# Patient Record
Sex: Male | Born: 1980 | Race: Black or African American | Hispanic: No | Marital: Single
Health system: Midwestern US, Community
[De-identification: ages and names within clinical notes are randomized; demographics above are authoritative.]

## PROBLEM LIST (undated history)

## (undated) DIAGNOSIS — F1910 Other psychoactive substance abuse, uncomplicated: Secondary | ICD-10-CM

## (undated) HISTORY — PX: MANDIBLE FRACTURE SURGERY: SHX706

## (undated) HISTORY — DX: Other psychoactive substance abuse, uncomplicated: F19.10

---

## 2011-02-15 NOTE — ED Notes (Signed)
Pt states "has been having pain in both hands in the joints. The pain is worse when using them."

## 2011-02-15 NOTE — ED Notes (Signed)
Copy of discharge instructions given to patient. Denies any questions at this time.

## 2011-02-15 NOTE — ED Notes (Signed)
Practioner discussing test results with patient.

## 2011-02-15 NOTE — ED Provider Notes (Signed)
HPI Comments: Patient presents for evaluation of pain in the joints of his hands bilaterally for the last several months.  Pain is made worse when using them at work.  Patient has a job a the chicken farm. He denies erythema, edema, or stiffness.  No family history of RA or gout.    Patient is a 30 y.o. male presenting with hand pain. The history is provided by the patient.   Hand Pain   This is a chronic problem. The current episode started more than 1 week ago. The problem occurs daily. The problem has been gradually worsening. The pain is present in the left hand and right hand. The pain is at a severity of 8/10. Pertinent negatives include no numbness, no stiffness, no tingling, no itching, no back pain and no neck pain. The symptoms are aggravated by movement. He has tried nothing for the symptoms. There has been no history of extremity trauma.        No past medical history on file.     Past Surgical History   Procedure Date   ??? Hx heent      jaw         No family history on file.     History     Social History   ??? Marital Status: Single     Spouse Name: N/A     Number of Children: N/A   ??? Years of Education: N/A     Occupational History   ??? Not on file.     Social History Main Topics   ??? Smoking status: Current Everyday Smoker -- 0.2 packs/day   ??? Smokeless tobacco: Not on file   ??? Alcohol Use: Yes   ??? Drug Use: Yes     Special: Marijuana   ??? Sexually Active:      Other Topics Concern   ??? Not on file     Social History Narrative   ??? No narrative on file                  ALLERGIES: Review of patient's allergies indicates no known allergies.      Review of Systems   Constitutional: Negative for fever, activity change, fatigue and unexpected weight change.   HENT: Negative for neck pain and neck stiffness.    Musculoskeletal: Positive for arthralgias. Negative for myalgias, back pain, joint swelling, gait problem and stiffness.   Skin: Negative.  Negative for itching.    Neurological: Negative for tingling and numbness.       Filed Vitals:    02/15/11 1801   BP: 127/73   Pulse: 94   Temp: 98.4 ??F (36.9 ??C)   Resp: 16   Height: 5\' 9"  (1.753 m)   Weight: 63.504 kg (140 lb)   SpO2: 99%            Physical Exam   [nursing notereviewed.  Constitutional: He appears well-developed and well-nourished. No distress.   HENT:   Head: Normocephalic and atraumatic.   Eyes: Conjunctivae and EOM are normal. Right eye exhibits no discharge. Left eye exhibits no discharge. No scleral icterus.   Neck: Normal range of motion. Neck supple.   Cardiovascular: Normal rate.    Pulmonary/Chest: Effort normal. No respiratory distress.   Musculoskeletal: Normal range of motion. He exhibits no edema and no tenderness.        Right hand: He exhibits normal range of motion, no tenderness, no bony tenderness, normal capillary refill, no deformity, no laceration  and no swelling. normal sensation noted. Normal strength noted.        Left hand: He exhibits normal range of motion, no tenderness, no bony tenderness, normal capillary refill, no deformity, no laceration and no swelling. normal sensation noted. Normal strength noted.   Neurological: He is alert. He exhibits abnormal muscle tone.   Skin: Skin is warm and dry. No rash noted. He is not diaphoretic. No erythema. No pallor.   Psychiatric: He has a normal mood and affect.        MDM     Differential Diagnosis; Clinical Impression; Plan:     [X ray exam of both hands unremarkable.  Amount and/or Complexity of Data Reviewed:   Tests in the radiology section of CPT??:  [ordered and reviewed   Obtain history from someone other than the patient:  No   Review and summarize past medical records:  No   Discuss the patient with another provider:  No   Independant visualization of image, tracing, or specimen:  Yes  Risk of Significant Complications, Morbidity, and/or Mortality:   Presenting problems:  [moderate  Diagnostic procedures:  [moderate   Management options:  [low  Progress:   Patient progress:  [stable      Procedures

## 2011-02-16 MED ORDER — DICLOFENAC 75 MG TAB, DELAYED RELEASE
75 mg | ORAL_TABLET | Freq: Two times a day (BID) | ORAL | Status: AC
Start: 2011-02-16 — End: ?

## 2011-02-25 MED ORDER — HYDROCODONE-ACETAMINOPHEN 7.5 MG-500 MG TAB
ORAL_TABLET | ORAL | Status: AC | PRN
Start: 2011-02-25 — End: ?

## 2011-02-25 NOTE — ED Notes (Signed)
Consulted Dr. Manson Passey who was in agreement with assessment and plan to reduce angulation and apply ulnar gutter splint with followup with Dr. Manson Passey.

## 2011-02-25 NOTE — ED Provider Notes (Signed)
Patient is a 30 y.o. male presenting with hand swelling. The history is provided by the patient.   Hand Swelling   This is a new problem. The current episode started yesterday. The problem occurs constantly. The problem has not changed since onset.The pain is at a severity of 8/10. Associated symptoms include limited range of motion and stiffness. Pertinent negatives include no numbness.        No past medical history on file.     Past Surgical History   Procedure Date   ??? Hx heent      jaw   ??? Hx orthopaedic          No family history on file.     History     Social History   ??? Marital Status: Single     Spouse Name: N/A     Number of Children: N/A   ??? Years of Education: N/A     Occupational History   ??? Not on file.     Social History Main Topics   ??? Smoking status: Current Everyday Smoker -- 0.2 packs/day   ??? Smokeless tobacco: Never Used   ??? Alcohol Use: Yes   ??? Drug Use: Yes     Special: Marijuana   ??? Sexually Active:      Other Topics Concern   ??? Not on file     Social History Narrative   ??? No narrative on file                  ALLERGIES: Review of patient's allergies indicates no known allergies.      Review of Systems   Constitutional: Negative.  Negative for fever, diaphoresis and unexpected weight change.   Respiratory: Negative for cough and shortness of breath.    Cardiovascular: Negative for chest pain and palpitations.   Musculoskeletal: Positive for joint swelling, arthralgias and stiffness.   Skin: Negative for color change and pallor.   Neurological: Negative for numbness.   Psychiatric/Behavioral: Negative.  Negative for behavioral problems, confusion, self-injury and agitation.   [all other systems reviewed and are negative        Filed Vitals:    02/25/11 1125   BP: 123/67   Pulse: 97   Temp: 98.5 ??F (36.9 ??C)   Resp: 16   Height: 5\' 9"  (1.753 m)   Weight: 63.504 kg (140 lb)   SpO2: 99%            Physical Exam   [nursing notereviewed.   Constitutional: He is oriented to person, place, and time. He appears well-developed and well-nourished.  Non-toxic appearance. He does not have a sickly appearance. He does not appear ill. No distress.   HENT:   Head: Normocephalic and atraumatic.   Eyes: Conjunctivae and EOM are normal. Pupils are equal, round, and reactive to light.   Neck: Normal range of motion. Neck supple.   Cardiovascular: Normal rate, regular rhythm and normal heart sounds.  PMI is not displaced.  Exam reveals no gallop.    No murmur heard.  Pulmonary/Chest: Effort normal and breath sounds normal. No respiratory distress. He has no decreased breath sounds. He has no wheezes. He has no rhonchi. He has no rales.   Abdominal: Soft. There is no tenderness.   Musculoskeletal: He exhibits no edema and no tenderness.        Right hand: He exhibits decreased range of motion, tenderness, bony tenderness, deformity and swelling. He exhibits normal two-point discrimination, normal capillary refill and  no laceration. normal sensation noted. Normal strength noted.        Hands:  Neurological: He is alert and oriented to person, place, and time.   Skin: Skin is warm and dry. He is not diaphoretic.   Psychiatric: He has a normal mood and affect. His behavior is normal. Judgment and thought content normal.        MDM    Splint, Ulnar Gutter  Date/Time: 02/25/2011 1:29 PM  Performed by: PATime out: Immediately prior to the procedure a time out was called to verify the correct patient, procedure, equipment, staff and marking as appropriate..  Location details: right hand  Splint type: ulnar gutter  Approach: medial  Supplies used: Ortho-Glass  Post-procedure: The splinted body part was neurovascularly unchanged following the procedure.  Patient tolerance: Patient tolerated the procedure well with no immediate complications.  My total time at bedside, performing this procedure was 1-15 minutes.         I have discussed the results of labs, procedures, radiographs, treatments as well as any previous results found within the [facility] [employer] with the patient and available family.?? A treatment plan was developed in conjunction with the patient and was agreed upon. The patient is ready for discharge at this time.?? All voiced understanding of the discharge plan and medication instructions or changes as appropriate.?? Questions about treatment in the ED were answered.?? The patient was encouraged to return should symptoms worsen or new problems develop. A follow up physician was provided to the patient on the discharge papers.

## 2011-02-25 NOTE — ED Notes (Signed)
Pt hit cabinet has swelling to right hand.

## 2011-02-25 NOTE — ED Notes (Signed)
Copy of discharge instructions given to patient. Denies any questions at this time.

## 2018-08-21 ENCOUNTER — Encounter (HOSPITAL_COMMUNITY): Payer: Self-pay | Admitting: Emergency Medicine

## 2018-08-21 ENCOUNTER — Emergency Department (HOSPITAL_COMMUNITY)
Admission: EM | Admit: 2018-08-21 | Discharge: 2018-08-21 | Disposition: A | Discharge status: Home / Self Care | Payer: Self-pay | Attending: Emergency Medicine | Admitting: Emergency Medicine

## 2018-08-21 ENCOUNTER — Emergency Department (HOSPITAL_COMMUNITY): Payer: Self-pay

## 2018-08-21 DIAGNOSIS — R6884 Jaw pain: Secondary | ICD-10-CM | POA: Insufficient documentation

## 2018-08-21 DIAGNOSIS — F17210 Nicotine dependence, cigarettes, uncomplicated: Secondary | ICD-10-CM | POA: Insufficient documentation

## 2018-08-21 NOTE — ED Triage Notes (Signed)
BIB EMS from home reporting jaw pain. Pt states he broke his jaw a few weeks ago and had surgery to repair. Pt states the surgery was "botched" and he wants to sue the doctor because "something is poking out" in his mouth. Pt has taken nothing for pain.

## 2018-08-21 NOTE — ED Notes (Signed)
Pt not able to report a change or new injury only stating his jaw repair is still "irritating" and he thought he would come get it checked.,

## 2018-08-21 NOTE — ED Notes (Signed)
Patient verbalizes understanding of discharge instructions. Opportunity for questioning and answers were provided. Armband removed by staff, pt discharged from ED home via POV.  

## 2018-08-21 NOTE — Discharge Instructions (Addendum)
You have been seen today for jaw pain. Please read and follow all provided instructions.   1. Medications: usual home medications 2. Treatment: rest, drink plenty of fluids 3. Follow Up: Please call and schedule an appointment in 2-3 days with the numbers provided for further assessment of your jaw pain. Please follow up with your primary doctor in 7 days for discussion of your diagnoses and further evaluation after today's visit; if you do not have a primary care doctor use the resource guide provided to find one; Please return to the ER for any new or worsening symptoms. Please obtain all of your results from medical records or have your doctors office obtain the results - share them with your doctor - you should be seen at your doctors office. Call today to arrange your follow up.   Take medications as prescribed. Please review all of the medicines and only take them if you do not have an allergy to them. Return to the emergency room for worsening condition or new concerning symptoms. Follow up with your regular doctor. If you don't have a regular doctor use one of the numbers below to establish a primary care doctor.  Please be aware that if you are taking birth control pills, taking other prescriptions, ESPECIALLY ANTIBIOTICS may make the birth control ineffective - if this is the case, either do not engage in sexual activity or use alternative methods of birth control such as condoms until you have finished the medicine and your family doctor says it is OK to restart them. If you are on a blood thinner such as COUMADIN, be aware that any other medicine that you take may cause the coumadin to either work too much, or not enough - you should have your coumadin level rechecked in next 7 days if this is the case.  ?  It is also a possibility that you have an allergic reaction to any of the medicines that you have been prescribed - Everybody reacts differently to medications and while MOST people have no  trouble with most medicines, you may have a reaction such as nausea, vomiting, rash, swelling, shortness of breath. If this is the case, please stop taking the medicine immediately and contact your physician.  ?  You should return to the ER if you develop severe or worsening symptoms.   Emergency Department Resource Guide 1) Find a Doctor and Pay Out of Pocket Although you won't have to find out who is covered by your insurance plan, it is a good idea to ask around and get recommendations. You will then need to call the office and see if the doctor you have chosen will accept you as a new patient and what types of options they offer for patients who are self-pay. Some doctors offer discounts or will set up payment plans for their patients who do not have insurance, but you will need to ask so you aren't surprised when you get to your appointment.  2) Contact Your Local Health Department Not all health departments have doctors that can see patients for sick visits, but many do, so it is worth a call to see if yours does. If you don't know where your local health department is, you can check in your phone book. The CDC also has a tool to help you locate your state's health department, and many state websites also have listings of all of their local health departments.  3) Find a Walk-in Clinic If your illness is not likely to  be very severe or complicated, you may want to try a walk in clinic. These are popping up all over the country in pharmacies, drugstores, and shopping centers. They're usually staffed by nurse practitioners or physician assistants that have been trained to treat common illnesses and complaints. They're usually fairly quick and inexpensive. However, if you have serious medical issues or chronic medical problems, these are probably not your best option.  No Primary Care Doctor: Call Health Connect at  [phone] - they can help you locate a primary care doctor that  accepts your  insurance, provides certain services, etc. Physician Referral [phone]  Emergency Department Resource Guide 1) Find a Doctor and Pay Out of Pocket Although you won't have to find out who is covered by your insurance plan, it is a good idea to ask around and get recommendations. You will then need to call the office and see if the doctor you have chosen will accept you as a new patient and what types of options they offer for patients who are self-pay. Some doctors offer discounts or will set up payment plans for their patients who do not have insurance, but you will need to ask so you aren't surprised when you get to your appointment.  2) Contact Your Local Health Department Not all health departments have doctors that can see patients for sick visits, but many do, so it is worth a call to see if yours does. If you don't know where your local health department is, you can check in your phone book. The CDC also has a tool to help you locate your state's health department, and many state websites also have listings of all of their local health departments.  3) Find a Walk-in Clinic If your illness is not likely to be very severe or complicated, you may want to try a walk in clinic. These are popping up all over the country in pharmacies, drugstores, and shopping centers. They're usually staffed by nurse practitioners or physician assistants that have been trained to treat common illnesses and complaints. They're usually fairly quick and inexpensive. However, if you have serious medical issues or chronic medical problems, these are probably not your best option.  No Primary Care Doctor: Call Health Connect at  [phone] - they can help you locate a primary care doctor that  accepts your insurance, provides certain services, etc. Physician Referral Service- [phone]  Chronic Pain Problems: Organization         Address  Phone   Notes  [facility]  [phone]  Patients need to be referred by their primary care doctor.   Medication Assistance: Organization         Address  Phone   Notes  [facility] Medication [facility] [address] [place] [place], [place] [address] [phone] --Must be a resident of [facility] -- Must have NO insurance coverage whatsoever (no Medicaid/ Medicare, etc.) -- The pt. MUST have a primary care doctor that directs their care regularly and follows them in the community   [facility]  [phone]   [employer]  [phone]    Agencies that provide inexpensive medical care: Organization         Address  Phone   Notes  [facility]  [phone]   [facility]    [phone]   [facility] [address] [place], [place] [address] [phone]   [facility] [address] [place] [phone]   [facility]anned Parenthood    [phone]   [facility]    [phone]   Community [facility] and [facility]  [address], [place] Phone:  [phone], Fax:  [phone] Hours of Operation:  9 am - 6 pm, M-F.  Also accepts Medicaid/Medicare and self-pay.  [facility]  [address], [place] Phone: [phone], Fax: [phone]. Hours of Operation:  8:30 am - 5:30 pm, M-F.  Also accepts Medicaid and self-pay.  [facility] [place] [address] [place] Phone: [phone]   [facility] [address] Natasha Bence [place], [place] [phone], Ext. [phone] Mondays & Thursdays: 7-9 AM.  First 15 patients are seen on a first come, first serve basis.    Medicaid-accepting [facility] Providers:  Organization         Address  Phone   Notes  [facility] [address], [place] [phone] Also accepts self-pay patients.  [facility] [address] Laurell Josephs [place], [place]  [phone]   [facility] [address], [place] [phone]   [facility] [address], [place] [phone]   Renaye Rakers [address], [place]   [phone] Only accepts [place] patients after they have their name applied to their card.   Self-Pay (no insurance) in [facility]:  Organization         Address  Phone   Notes  Sickle Cell Patients, [facility] [address] [place], [place] [phone]   [facility] [address] [place], [place] [phone]   [facility] [place] [facility]  [address], [place] [phone]   [facility]/Dr. Osei-Bonsu  [address] [place] or [address], [place] [phone] Phone number for both [place] and [place] locations is the same.  [facility]gent [facility] and [facility] [address] [place] [phone]   [facility] [address], [place] or [address] [phone]   [facility] [address], [place] [phone], phone; [phone], fax Sees patients 1st and 3rd Saturday of every month.  Must not qualify for public or private insurance (i.e. Medicaid, Medicare, [place] Health Choice, Veterans' Benefits)  Household income should be no more than 200% of the poverty level The clinic cannot treat you if you are pregnant or think you are pregnant  Sexually transmitted diseases are not treated at the clinic.
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
## 2018-08-27 ENCOUNTER — Other Ambulatory Visit: Payer: Self-pay

## 2018-08-27 ENCOUNTER — Encounter (HOSPITAL_COMMUNITY): Payer: Self-pay | Admitting: Emergency Medicine

## 2018-08-27 ENCOUNTER — Emergency Department (HOSPITAL_COMMUNITY)
Admission: EM | Admit: 2018-08-27 | Discharge: 2018-08-27 | Disposition: A | Discharge status: Home / Self Care | Payer: Self-pay | Attending: Emergency Medicine | Admitting: Emergency Medicine

## 2018-08-27 DIAGNOSIS — R45851 Suicidal ideations: Secondary | ICD-10-CM

## 2018-08-27 DIAGNOSIS — F1014 Alcohol abuse with alcohol-induced mood disorder: Secondary | ICD-10-CM | POA: Diagnosis present

## 2018-08-27 DIAGNOSIS — F17210 Nicotine dependence, cigarettes, uncomplicated: Secondary | ICD-10-CM | POA: Insufficient documentation

## 2018-08-27 LAB — CBC
HCT: 39.1 % (ref 39.0–52.0)
Hemoglobin: 12.7 g/dL — ABNORMAL LOW (ref 13.0–17.0)
MCH: 31.2 pg (ref 26.0–34.0)
MCHC: 32.5 g/dL (ref 30.0–36.0)
MCV: 96.1 fL (ref 80.0–100.0)
Platelets: 298 10*3/uL (ref 150–400)
RBC: 4.07 MIL/uL — ABNORMAL LOW (ref 4.22–5.81)
RDW: 14.1 % (ref 11.5–15.5)
WBC: 5.3 10*3/uL (ref 4.0–10.5)
nRBC: 0 % (ref 0.0–0.2)

## 2018-08-27 LAB — COMPREHENSIVE METABOLIC PANEL
ALK PHOS: 46 U/L (ref 38–126)
ALT: 19 U/L (ref 0–44)
AST: 32 U/L (ref 15–41)
Albumin: 4.5 g/dL (ref 3.5–5.0)
Anion gap: 12 (ref 5–15)
BUN: 14 mg/dL (ref 6–20)
CO2: 26 mmol/L (ref 22–32)
CREATININE: 1.32 mg/dL — AB (ref 0.61–1.24)
Calcium: 9 mg/dL (ref 8.9–10.3)
Chloride: 102 mmol/L (ref 98–111)
GFR calc Af Amer: >60 mL/min (ref >60)
GFR calc non Af Amer: >60 mL/min (ref >60)
Glucose, Bld: 87 mg/dL (ref 70–99)
Potassium: 3.5 mmol/L (ref 3.5–5.1)
Sodium: 140 mmol/L (ref 135–145)
Total Bilirubin: 0.5 mg/dL (ref 0.3–1.2)
Total Protein: 7.2 g/dL (ref 6.5–8.1)

## 2018-08-27 LAB — ETHANOL: Alcohol, Ethyl (B): 35 mg/dL — ABNORMAL HIGH (ref <10)

## 2018-08-27 LAB — ACETAMINOPHEN LEVEL: Acetaminophen (Tylenol), Serum: <10 ug/mL — ABNORMAL LOW (ref 10–30)

## 2018-08-27 LAB — SALICYLATE LEVEL: Salicylate Lvl: <7 mg/dL (ref 2.8–30.0)

## 2018-08-27 NOTE — [facility] Suicide Risk Assessment (Signed)
Suicide Risk Assessment  Discharge Assessment   [facility]   Principal Problem: Alcohol abuse with alcohol-induced mood disorder [facility](HCC) Discharge Diagnoses: Principal Problem:   Alcohol abuse with alcohol-induced mood disorder (HCC)   Total Time spent with patient: 45 minutes  Musculoskeletal: Strength & Muscle Tone: within normal limits Gait & Station: normal Patient leans: N/A  Psychiatric Specialty Exam:   Blood pressure 109/73, pulse 96, temperature 98.3 F (36.8 C), temperature source Oral, resp. rate 18, height 5\' 9"  (1.753 m), weight 63.5 kg, SpO2 96 %.Body mass index is 20.67 kg/m.  General Appearance: Casual  Eye Contact::  Good  Speech:  Normal Rate409  Volume:  Decreased  Mood:  Irritable  Affect:  Congruent  Thought Process:  Coherent and Descriptions of Associations: Intact  Orientation:  Full (Time, Place, and Person)  Thought Content:  WDL and Logical  Suicidal Thoughts:  No  Homicidal Thoughts:  No  Memory:  Immediate;   Good Recent;   Good Remote;   Good  Judgement:  Good  Insight:  Fair  Psychomotor Activity:  Normal  Concentration:  Good  Recall:  Good  Fund of Knowledge:Good  Language: Good  Akathisia:  No  Handed:  Right  AIMS (if indicated):     Assets:  Leisure Time Physical Health Resilience  Sleep:     Cognition: WNL  ADL's:  Intact   Mental Status Per Nursing Assessment::   On Admission:   37 yo male who presented to the ED with jaw pain, substance abuse, and passive suicidal ideations.  He was upset they locked up his book with his belongings and refused to talk to TTS.  On assessment by the providers, he denies suicidal/homicidal ideations, hallucinations, or withdrawal symptoms.  Irritable mood with not wanting to talk but minimally.  Stable for discharge.  Demographic Factors:  Male  Loss Factors: NA  Historical Factors: NA  Risk Reduction Factors:   Sense of responsibility to family and Positive  social support  Continued Clinical Symptoms:  Irritability  Cognitive Features That Contribute To Risk:  None    Suicide Risk:  Minimal: No identifiable suicidal ideation.  Patients presenting with no risk factors but with morbid ruminations; may be classified as minimal risk based on the severity of the depressive symptoms    Plan Of Care/Follow-up recommendations:  Activity:  as tolerated Diet:  heart healthy diet  Alexi Geibel, NP 08/27/2018, 11:08 AM

## 2018-08-27 NOTE — ED Notes (Signed)
Patient refusing to get out of bed, blanket over head, will not make eye contact. Earlier refused to to talk with assessment team and psychiatric team. Unsure of what patient is needed. Belongings gathered and will have security escort patient out with paperwork and bus pass.

## 2018-08-27 NOTE — ED Triage Notes (Signed)
[facility] and [facility]. Pt denies any plan at this time.

## 2018-08-27 NOTE — Progress Notes (Signed)
TTS went to assess pt. Pt refused to speak with this Clinical research associatewriter. Pt states he was told he would be allowed to keep his book. Pt states he is not going to speak to anyone until his book is returned to him. Per nursing staff book is hardback and not allowed on unit. Pt refuses to answer questions or speak to this writer for the assessment. Pt to be assessed when he is willing to participate.   Christian Williams, MSW, LCSW Therapeutic Triage Specialist  [phone]

## 2018-08-27 NOTE — ED Notes (Signed)
Bed: WLPT4 Expected date:  Expected time:  Means of arrival:  Comments: EMS SI/depression

## 2018-08-27 NOTE — ED Provider Notes (Signed)
[facility]-EMERGENCY DEPT Provider Note   CSN: [id number] Arrival date & time: 08/27/18  0348     History   Chief Complaint Chief Complaint  Patient presents with  . Suicidal    HPI Christian Williams is a 37 y.o. male.  The history is provided by the patient and medical records.     37 year old male with no significant past medical history presenting to the ED for suicidal ideation.  Initially he refused to answer my questions or tell me what brought him into the ED today, he stated "I don't want to talk about".  He did eventually tell me "i've been going through some stuff".  He denies any specific plan of suicide.  He does report feeling homicidal-- reports "towards everyone".  He denies any auditory or visual hallucinations.    History reviewed. No pertinent past medical history.  There are no active problems to display for this patient.   Past Surgical History:  Procedure Laterality Date  . MANDIBLE FRACTURE SURGERY          Home Medications    Prior to Admission medications   Not on File    Family History History reviewed. No pertinent family history.  Social History Social History   Tobacco Use  . Smoking status: Current Some Day Smoker    Packs/day: 0.25    Types: Cigarettes  . Smokeless tobacco: Never Used  Substance Use Topics  . Alcohol use: Not Currently  . Drug use: Yes    Types: Cocaine, Marijuana     Allergies   Patient has no known allergies.   Review of Systems Review of Systems  Psychiatric/Behavioral: Positive for suicidal ideas.  All other systems reviewed and are negative.    Physical Exam Updated Vital Signs BP 109/73 (BP Location: Left Arm)   Pulse 96   Temp 98.3 F (36.8 C) (Oral)   Resp 18   Ht 5\' 9"  (1.753 m)   Wt 63.5 kg   SpO2 96%   BMI 20.67 kg/m   Physical Exam Vitals signs and nursing note reviewed.  Constitutional:      Appearance: He is well-developed.     Comments: Sleeping with  blankets over head, awoken for exam but will not remove blankets from head  HENT:     Head: Normocephalic and atraumatic.  Eyes:     Conjunctiva/sclera: Conjunctivae normal.     Pupils: Pupils are equal, round, and reactive to light.  Neck:     Musculoskeletal: Normal range of motion.  Cardiovascular:     Rate and Rhythm: Normal rate and regular rhythm.     Heart sounds: Normal heart sounds.  Pulmonary:     Effort: Pulmonary effort is normal.     Breath sounds: Normal breath sounds.  Abdominal:     General: Bowel sounds are normal.     Palpations: Abdomen is soft.  Musculoskeletal: Normal range of motion.  Skin:    General: Skin is warm and dry.  Neurological:     Mental Status: He is alert and oriented to person, place, and time.  Psychiatric:        Attention and Perception: He does not perceive auditory hallucinations.        Thought Content: Thought content includes suicidal ideation. Thought content does not include homicidal ideation. Thought content does not include homicidal or suicidal plan.      ED Treatments / Results  Labs (all labs ordered are listed, but only abnormal results are displayed)  Labs Reviewed  COMPREHENSIVE METABOLIC PANEL - Abnormal; Notable for the following components:      Result Value   Creatinine, Ser 1.32 (*)    All other components within normal limits  ETHANOL - Abnormal; Notable for the following components:   Alcohol, Ethyl (B) 35 (*)    All other components within normal limits  ACETAMINOPHEN LEVEL - Abnormal; Notable for the following components:   Acetaminophen (Tylenol), Serum <10 (*)    All other components within normal limits  CBC - Abnormal; Notable for the following components:   RBC 4.07 (*)    Hemoglobin 12.7 (*)    All other components within normal limits  SALICYLATE LEVEL  RAPID URINE DRUG SCREEN, HOSP PERFORMED    EKG None  Radiology No results found.  Procedures Procedures (including critical care  time)  Medications Ordered in ED Medications - No data to display   Initial Impression / Assessment and Plan / ED Course  I have reviewed the triage vital signs and the nursing notes.  Pertinent labs & imaging results that were available during my care of the patient were reviewed by me and considered in my medical decision making (see chart for details).  37 year old male here with suicidal ideation.  He refuses to answer most of my questions.  He does eventually tell me he has been "going through some stuff" but will not elaborate further.  No plan of suicide.  Reports HI against "everyone".  No AVH.  Screening labs here are overall reassuring, ethanol 35.  Patient medically cleared.  TTS evaluation pending.  TTS attempted to speak with patient-- he was unwilling to speak with them due to his book being taken away. This is hardback and not allowed in unit.  They will try to assess again later this morning.  Final Clinical Impressions(s) / ED Diagnoses   Final diagnoses:  Suicidal ideation    ED Discharge Orders    None       Garlon Hatchet, PA-C 08/27/18 1610    Devoria Albe, MD 08/27/18 [phone]

## 2018-08-27 NOTE — [facility] Suicide Risk Assessment (Signed)
37 yo male who presented to the ED with jaw pain, substance abuse, and passive suicidal ideations.  He was upset they locked up his book with his belongings and refused to talk to TTS.  On assessment by the providers, he denies suicidal/homicidal ideations, hallucinations, or withdrawal symptoms.  Irritable mood with not wanting to talk but minimally.  Stable for discharge.  Nanine MeansJamison Jaylynne Birkhead, PMHN

## 2018-08-27 NOTE — ED Notes (Addendum)
Escorted out without security

## 2020-07-06 ENCOUNTER — Encounter (HOSPITAL_COMMUNITY): Payer: Self-pay | Admitting: Emergency Medicine

## 2020-07-06 ENCOUNTER — Other Ambulatory Visit: Payer: Self-pay

## 2020-07-06 ENCOUNTER — Emergency Department (HOSPITAL_COMMUNITY)
Admission: EM | Admit: 2020-07-06 | Discharge: 2020-07-06 | Disposition: A | Discharge status: Home / Self Care | Payer: Self-pay | Attending: Emergency Medicine | Admitting: Emergency Medicine

## 2020-07-06 DIAGNOSIS — K921 Melena: Secondary | ICD-10-CM

## 2020-07-06 DIAGNOSIS — F17210 Nicotine dependence, cigarettes, uncomplicated: Secondary | ICD-10-CM | POA: Insufficient documentation

## 2020-07-06 DIAGNOSIS — N342 Other urethritis: Secondary | ICD-10-CM

## 2020-07-06 DIAGNOSIS — N341 Nonspecific urethritis: Secondary | ICD-10-CM | POA: Insufficient documentation

## 2020-07-06 LAB — CBC WITH DIFFERENTIAL/PLATELET
Abs Immature Granulocytes: 0.01 10*3/uL (ref 0.00–0.07)
Basophils Absolute: 0 10*3/uL (ref 0.0–0.1)
Basophils Relative: 1 %
Eosinophils Absolute: 0.1 10*3/uL (ref 0.0–0.5)
Eosinophils Relative: 1 %
HCT: 38.5 % — ABNORMAL LOW (ref 39.0–52.0)
Hemoglobin: 12.9 g/dL — ABNORMAL LOW (ref 13.0–17.0)
Immature Granulocytes: 0 %
Lymphocytes Relative: 29 %
Lymphs Abs: 1.7 10*3/uL (ref 0.7–4.0)
MCH: 33.2 pg (ref 26.0–34.0)
MCHC: 33.5 g/dL (ref 30.0–36.0)
MCV: 99.2 fL (ref 80.0–100.0)
Monocytes Absolute: 0.5 10*3/uL (ref 0.1–1.0)
Monocytes Relative: 9 %
Neutro Abs: 3.4 10*3/uL (ref 1.7–7.7)
Neutrophils Relative %: 60 %
Platelets: 269 10*3/uL (ref 150–400)
RBC: 3.88 MIL/uL — ABNORMAL LOW (ref 4.22–5.81)
RDW: 14.1 % (ref 11.5–15.5)
WBC: 5.6 10*3/uL (ref 4.0–10.5)
nRBC: 0 % (ref 0.0–0.2)

## 2020-07-06 LAB — URINALYSIS, ROUTINE W REFLEX MICROSCOPIC
Bilirubin Urine: NEGATIVE
Glucose, UA: NEGATIVE mg/dL
Ketones, ur: NEGATIVE mg/dL
Nitrite: NEGATIVE
Protein, ur: NEGATIVE mg/dL
Specific Gravity, Urine: 1.016 (ref 1.005–1.030)
WBC, UA: >50 WBC/hpf — ABNORMAL HIGH (ref 0–5)
pH: 5 (ref 5.0–8.0)

## 2020-07-06 LAB — HIV ANTIBODY (ROUTINE TESTING W REFLEX): HIV Screen 4th Generation wRfx: NONREACTIVE

## 2020-07-06 MED ORDER — LIDOCAINE HCL (PF) 1 % IJ SOLN
INTRAMUSCULAR | Status: AC
  Filled 2020-07-06: qty 5

## 2020-07-06 MED ORDER — AZITHROMYCIN 1 G PO PACK
1.0000 g | PACK | Freq: Once | ORAL | Status: AC
  Administered 2020-07-06: 1 g via ORAL
  Filled 2020-07-06: qty 1

## 2020-07-06 MED ORDER — CEFTRIAXONE SODIUM 500 MG IJ SOLR
500.0000 mg | Freq: Once | INTRAMUSCULAR | Status: AC
  Administered 2020-07-06: 500 mg via INTRAMUSCULAR
  Filled 2020-07-06: qty 500

## 2020-07-06 MED ORDER — CEPHALEXIN 500 MG PO CAPS: 500.0000 mg | ORAL_CAPSULE | Freq: Three times a day (TID) | ORAL | 0 refills | Status: DC

## 2020-07-06 NOTE — ED Notes (Signed)
Pt verbalized understanding of discharge paperwork, prescriptions and follow-up care 

## 2020-07-06 NOTE — ED Triage Notes (Signed)
Pt reports blood on toilet paper when he wipes intermittently x 1 year.  Also reports penile discharge and groin itching since last night.  Requesting STD testing.

## 2020-07-06 NOTE — ED Provider Notes (Signed)
[facility] EMERGENCY DEPARTMENT Provider Note   CSN: [id number] Arrival date & time: 07/06/20  1110     History Chief Complaint  Patient presents with  . Penile Discharge  . Blood In Stools    Lazaro Isenhower is a 39 y.o. male.  The history is provided by the patient and medical records.  Penile Discharge   Zahki Hoogendoorn is a 39 y.o. male who presents to the Emergency Department complaining of penile discharge and blood he stools. He presents the emergency department with two complaints. First complaint is tan colored penile discharge that started last night. He reports that he has itching when he urinates. He feels like it smells like yeast. He is sexually active with women, does not consistently use protection. He does have a former history of gonorrhea and chlamydia, status post treatment. He states that this feels different from prior episodes. He also complains of greater than one year of occasional blood when he wipes after having a bowel movement. He states that his stools are very inconsistency. He is not having any blood he stools. He states when he wipes there is sometimes blood on the tissue. He has no pain with bowel movements. He denies any shortness of breath, lightheadedness, nausea, vomiting. He does have occasional abdominal cramping since last night.    History reviewed. No pertinent past medical history.  Patient Active Problem List   Diagnosis Date Noted  . Alcohol abuse with alcohol-induced mood disorder (HCC) 08/27/2018    Past Surgical History:  Procedure Laterality Date  . MANDIBLE FRACTURE SURGERY         No family history on file.  Social History   Tobacco Use  . Smoking status: Current Some Day Smoker    Packs/day: 0.25    Types: Cigarettes  . Smokeless tobacco: Never Used  Substance Use Topics  . Alcohol use: Not Currently  . Drug use: Yes    Types: Cocaine, Marijuana    Home Medications Prior to Admission medications    Medication Sig Start Date End Date Taking? Authorizing Provider  cephALEXin (KEFLEX) 500 MG capsule Take 1 capsule (500 mg total) by mouth 3 (three) times daily. 07/06/20   Tilden Fossa, MD    Allergies    Patient has no known allergies.  Review of Systems   Review of Systems  Genitourinary: Positive for discharge.  All other systems reviewed and are negative.   Physical Exam Updated Vital Signs BP 121/73 (BP Location: Right Arm)   Pulse 71   Temp 98.3 F (36.8 C) (Oral)   Resp 16   SpO2 100%   Physical Exam Vitals and nursing note reviewed.  Constitutional:      Appearance: He is well-developed.  HENT:     Head: Normocephalic and atraumatic.  Cardiovascular:     Rate and Rhythm: Normal rate and regular rhythm.  Pulmonary:     Effort: Pulmonary effort is normal. No respiratory distress.  Abdominal:     Palpations: Abdomen is soft.     Tenderness: There is no abdominal tenderness. There is no guarding or rebound.  Genitourinary:    Penis: Normal.      Comments: No external or palpable internal hemorrhoids on DRE.  No gross blood on rectal exam Musculoskeletal:        General: No tenderness.  Skin:    General: Skin is warm and dry.  Neurological:     Mental Status: He is alert and oriented to person, place, and time.  Psychiatric:        Behavior: Behavior normal.     ED Results / Procedures / Treatments   Labs (all labs ordered are listed, but only abnormal results are displayed) Labs Reviewed  URINALYSIS, ROUTINE W REFLEX MICROSCOPIC - Abnormal; Notable for the following components:      Result Value   APPearance HAZY (*)    Hgb urine dipstick SMALL (*)    Leukocytes,Ua LARGE (*)    WBC, UA >50 (*)    Bacteria, UA RARE (*)    All other components within normal limits  CBC WITH DIFFERENTIAL/PLATELET - Abnormal; Notable for the following components:   RBC 3.88 (*)    Hemoglobin 12.9 (*)    HCT 38.5 (*)    All other components within normal limits    URINE CULTURE  HIV ANTIBODY (ROUTINE TESTING W REFLEX)  RPR  GC/CHLAMYDIA PROBE AMP ([place]) NOT AT [facility]    EKG None  Radiology No results found.  Procedures Procedures (including critical care time)  Medications Ordered in ED Medications  cefTRIAXone (ROCEPHIN) injection 500 mg (has no administration in time range)  azithromycin (ZITHROMAX) powder 1 g (has no administration in time range)    ED Course  I have reviewed the triage vital signs and the nursing notes.  Pertinent labs & imaging results that were available during my care of the patient were reviewed by me and considered in my medical decision making (see chart for details).    MDM Rules/Calculators/A&P                         patient here for evaluation of penile discharge and dysuria. He is non-toxic appearing on evaluation. No discharge seen on evaluation. UA with numerous WBCs present. Will treat for urethritis and possible UTI and send cultures.  Patient also with complaints of rectal bleeding for one year. He has no active bleeding on examination. Hemoglobin similar when compared to priors. Discussed with patient PCP follow-up as well as return precautions. Final Clinical Impression(s) / ED Diagnoses Final diagnoses:  Urethritis  Infective urethritis    Rx / DC Orders ED Discharge Orders         Ordered    cephALEXin (KEFLEX) 500 MG capsule  3 times daily        07/06/20 1437           Tilden Fossa, MD 07/06/20 1439

## 2020-07-07 LAB — GC/CHLAMYDIA PROBE AMP (~~LOC~~) NOT AT ARMC
Chlamydia: NEGATIVE
Comment: NEGATIVE
Comment: NORMAL
Neisseria Gonorrhea: POSITIVE — AB

## 2020-07-07 LAB — URINE CULTURE: Culture: NO GROWTH

## 2020-07-07 LAB — RPR: RPR Ser Ql: NONREACTIVE

## 2020-07-13 ENCOUNTER — Ambulatory Visit (HOSPITAL_COMMUNITY)
Admission: EM | Admit: 2020-07-13 | Discharge: 2020-07-13 | Disposition: A | Discharge status: Home / Self Care | Payer: Self-pay

## 2020-07-13 ENCOUNTER — Ambulatory Visit (HOSPITAL_COMMUNITY)
Admission: EM | Admit: 2020-07-13 | Discharge: 2020-07-14 | Disposition: A | Discharge status: Home / Self Care | Payer: No Payment, Other | Attending: Psychiatry | Admitting: Psychiatry

## 2020-07-13 ENCOUNTER — Other Ambulatory Visit: Payer: Self-pay

## 2020-07-13 ENCOUNTER — Encounter (HOSPITAL_COMMUNITY): Payer: Self-pay

## 2020-07-13 DIAGNOSIS — Y900 Blood alcohol level of less than 20 mg/100 ml: Secondary | ICD-10-CM | POA: Insufficient documentation

## 2020-07-13 DIAGNOSIS — F1994 Other psychoactive substance use, unspecified with psychoactive substance-induced mood disorder: Secondary | ICD-10-CM | POA: Insufficient documentation

## 2020-07-13 DIAGNOSIS — F17210 Nicotine dependence, cigarettes, uncomplicated: Secondary | ICD-10-CM | POA: Insufficient documentation

## 2020-07-13 DIAGNOSIS — Z20822 Contact with and (suspected) exposure to covid-19: Secondary | ICD-10-CM | POA: Insufficient documentation

## 2020-07-13 DIAGNOSIS — R454 Irritability and anger: Secondary | ICD-10-CM | POA: Insufficient documentation

## 2020-07-13 DIAGNOSIS — F1094 Alcohol use, unspecified with alcohol-induced mood disorder: Secondary | ICD-10-CM | POA: Insufficient documentation

## 2020-07-13 DIAGNOSIS — F32A Depression, unspecified: Secondary | ICD-10-CM | POA: Insufficient documentation

## 2020-07-13 LAB — POCT URINE DRUG SCREEN - MANUAL ENTRY (I-SCREEN)
POC Amphetamine UR: NOT DETECTED
POC Buprenorphine (BUP): NOT DETECTED
POC Cocaine UR: POSITIVE — AB
POC Marijuana UR: POSITIVE — AB
POC Methadone UR: NOT DETECTED
POC Methamphetamine UR: NOT DETECTED
POC Morphine: NOT DETECTED
POC Oxazepam (BZO): NOT DETECTED
POC Oxycodone UR: NOT DETECTED
POC Secobarbital (BAR): NOT DETECTED

## 2020-07-13 MED ORDER — MAGNESIUM HYDROXIDE 400 MG/5ML PO SUSP: 30.0000 mL | Freq: Every day | ORAL | Status: DC | PRN

## 2020-07-13 MED ORDER — ALUM & MAG HYDROXIDE-SIMETH 200-200-20 MG/5ML PO SUSP: 30.0000 mL | ORAL | Status: DC | PRN

## 2020-07-13 MED ORDER — TRAZODONE HCL 50 MG PO TABS: 50.0000 mg | ORAL_TABLET | Freq: Every evening | ORAL | Status: DC | PRN

## 2020-07-13 MED ORDER — ACETAMINOPHEN 325 MG PO TABS: 650.0000 mg | ORAL_TABLET | Freq: Four times a day (QID) | ORAL | Status: DC | PRN

## 2020-07-13 NOTE — ED Triage Notes (Signed)
Pt arrives as walk in. Reports worsening depression but that he has hid it for his whole life. Reports that he can no longer hold it in. Pt reports intermittent thoughts of not wanting to be here but denies active SI. Pt reports inability to function or focus. Pt calm & cooperative, A&O x4. In no acute distress at this time.

## 2020-07-13 NOTE — ED Provider Notes (Signed)
Behavioral Health Admission [facility])  Date: 07/13/20 Patient Name: Christian Williams MRN: [id number] Chief Complaint:  Chief Complaint  Patient presents with  . Depression      Diagnoses:  Final diagnoses:  None    HPI: Christian Williams is a 39 year old patient who voluntarily came to Behavioral Health Urgent Care [facility]). The patient stated, "can't you see I need help." The patient was irritable when he was awakened. The patient had been sleeping once he sat on the sofa. He was quickly arousable but irritable and, at a time, using expletive words when this writer attempts to assess him. The patient is under the influence which he did admit to using THC and alcohol. UDS shows him being positive cocaine and THC.  The patient expressed he believed he needed both therapy and medication management and stated that he used 2 grams of marijuana and ingested two 16-ounce beers before coming in for assistance. He shared he had been experiencing thoughts of hurting himself (though denied plans to kill himself) and thoughts of harming someone else three months ago (though denied intends to harm someone at this time). He shares he does not currently have a therapist or a psychiatrist at this time.  PHQ 2-9:     ED from 08/27/2018 in [facility]  C-SSRS RISK CATEGORY Low Risk       Total Time spent with patient: 20 minutes  Musculoskeletal  Strength & Muscle Tone: within normal limits Gait & Station: normal Patient leans: N/A  Psychiatric Specialty Exam  Presentation General Appearance: Disheveled;Bizarre  Eye Contact:Poor  Speech:Garbled;Slow;Slurred  Speech Volume:Increased  Handedness:Right   Mood and Affect  Mood:Angry;Anxious;Irritable  Affect:Blunt;Constricted;Inappropriate   Thought Process  Thought Processes:Disorganized  Descriptions of Associations:Loose  Orientation:Full (Time, Place and Person)  Thought  Content:Scattered;Illogical  Hallucinations:Hallucinations: None  Ideas of Reference:None  Suicidal Thoughts:Suicidal Thoughts: Yes, Passive SI Passive Intent and/or Plan: Without Intent;Without Plan  Homicidal Thoughts:Homicidal Thoughts: No   Sensorium  Memory:Immediate Fair;Recent Fair;Remote Fair  Judgment:Impaired  Insight:Lacking   Executive Functions  Concentration:Poor  Attention Span:Poor  Recall:Poor  Fund of Knowledge:Poor  Language:Poor   Psychomotor Activity  Psychomotor Activity:Psychomotor Activity: Normal   Assets  Assets:Communication Skills;Desire for Improvement;Resilience;Social Support   Sleep  Sleep:Sleep: Fair   Physical Exam ROS  Blood pressure 117/75, pulse 92, temperature 97.8 F (36.6 C), temperature source Temporal, resp. rate 17, height 5' 4.96" (1.65 m), weight 140 lb (63.5 kg), SpO2 98 %. Body mass index is 23.33 kg/m.  Past Psychiatric History:   Is the patient at risk to self? No  Has the patient been a risk to self in the past 6 months? Yes .    Has the patient been a risk to self within the distant past? Yes   Is the patient a risk to others? No   Has the patient been a risk to others in the past 6 months? No   Has the patient been a risk to others within the distant past? No   Past Medical History: History reviewed. No pertinent past medical history.  Past Surgical History:  Procedure Laterality Date  . MANDIBLE FRACTURE SURGERY      Family History: History reviewed. No pertinent family history.  Social History:  Social History   Socioeconomic History  . Marital status: Single    Spouse name: Not on file  . Number of children: Not on file  . Years of education: Not on file  . Highest education level:  Not on file  Occupational History  . Not on file  Tobacco Use  . Smoking status: Current Some Day Smoker    Packs/day: 0.25    Types: Cigarettes  . Smokeless tobacco: Never Used  Vaping Use  . Vaping  Use: Never used  Substance and Sexual Activity  . Alcohol use: Not Currently  . Drug use: Yes    Types: Marijuana  . Sexual activity: Not on file  Other Topics Concern  . Not on file  Social History Narrative  . Not on file   Social Determinants of Health   Financial Resource Strain:   . Difficulty of Paying Living Expenses: Not on file  Food Insecurity:   . Worried About Programme researcher, broadcasting/film/video in the Last Year: Not on file  . Ran Out of Food in the Last Year: Not on file  Transportation Needs:   . Lack of Transportation (Medical): Not on file  . Lack of Transportation (Non-Medical): Not on file  Physical Activity:   . Days of Exercise per Week: Not on file  . Minutes of Exercise per Session: Not on file  Stress:   . Feeling of Stress : Not on file  Social Connections:   . Frequency of Communication with Friends and Family: Not on file  . Frequency of Social Gatherings with Friends and Family: Not on file  . Attends Religious Services: Not on file  . Active Member of Clubs or Organizations: Not on file  . Attends Banker Meetings: Not on file  . Marital Status: Not on file  Intimate Partner Violence:   . Fear of Current or Ex-Partner: Not on file  . Emotionally Abused: Not on file  . Physically Abused: Not on file  . Sexually Abused: Not on file    SDOH:  SDOH Screenings   Alcohol Screen:   . Last Alcohol Screening Score (AUDIT): Not on file  Depression (PHQ2-9):   . PHQ-2 Score: Not on file  Financial Resource Strain:   . Difficulty of Paying Living Expenses: Not on file  Food Insecurity:   . Worried About Programme researcher, broadcasting/film/video in the Last Year: Not on file  . Ran Out of Food in the Last Year: Not on file  Housing:   . Last Housing Risk Score: Not on file  Physical Activity:   . Days of Exercise per Week: Not on file  . Minutes of Exercise per Session: Not on file  Social Connections:   . Frequency of Communication with Friends and Family: Not on  file  . Frequency of Social Gatherings with Friends and Family: Not on file  . Attends Religious Services: Not on file  . Active Member of Clubs or Organizations: Not on file  . Attends Banker Meetings: Not on file  . Marital Status: Not on file  Stress:   . Feeling of Stress : Not on file  Tobacco Use: High Risk  . Smoking Tobacco Use: Current Some Day Smoker  . Smokeless Tobacco Use: Never Used  Transportation Needs:   . Freight forwarder (Medical): Not on file  . Lack of Transportation (Non-Medical): Not on file    Last Labs:  Admission on 07/06/2020, Discharged on 07/06/2020  Component Date Value Ref Range Status  . Color, Urine 07/06/2020 YELLOW  YELLOW Final  . APPearance 07/06/2020 HAZY* CLEAR Final  . Specific Gravity, Urine 07/06/2020 1.016  1.005 - 1.030 Final  . pH 07/06/2020 5.0  5.0 - 8.0  Final  . Glucose, UA 07/06/2020 NEGATIVE  NEGATIVE mg/dL Final  . Hgb urine dipstick 07/06/2020 SMALL* NEGATIVE Final  . Bilirubin Urine 07/06/2020 NEGATIVE  NEGATIVE Final  . Ketones, ur 07/06/2020 NEGATIVE  NEGATIVE mg/dL Final  . Protein, ur 58/52/7782 NEGATIVE  NEGATIVE mg/dL Final  . Nitrite 42/35/3614 NEGATIVE  NEGATIVE Final  . Glori Luis 07/06/2020 LARGE* NEGATIVE Final  . RBC / HPF 07/06/2020 0-5  0 - 5 RBC/hpf Final  . WBC, UA 07/06/2020 >50* 0 - 5 WBC/hpf Final  . Bacteria, UA 07/06/2020 RARE* NONE SEEN Final  . Mucus 07/06/2020 PRESENT   Final   Performed at [facility], [address] [place], [place] [address]  . Specimen Description 07/06/2020 URINE, RANDOM   Final  . Special Requests 07/06/2020 NONE   Final  . Culture 07/06/2020    Final                   Value:NO GROWTH Performed at [facility], [address], [place], [place] [address]   . Report Status 07/06/2020 07/07/2020 FINAL   Final  . Neisseria Gonorrhea 07/06/2020 Positive*  Final  . Chlamydia 07/06/2020 Negative   Final  . Comment 07/06/2020 Normal Reference  [place]anger Chlamydia - Negative   Final  . Comment 07/06/2020 Normal Reference Range Neisseria Gonorrhea - Negative   Final  . RPR Ser Ql 07/06/2020 NON REACTIVE  NON REACTIVE Final   Performed at [facility], [address] [place], [place] [address]  . HIV Screen 4th Generation wRfx 07/06/2020 Non Reactive  Non Reactive Final   Performed at [facility], [address], [place], [place] [address]  . WBC 07/06/2020 5.6  4.0 - 10.5 K/uL Final  . RBC 07/06/2020 3.88* 4.22 - 5.81 MIL/uL Final  . Hemoglobin 07/06/2020 12.9* 13.0 - 17.0 g/dL Final  . HCT 12/45/8099 38.5* 39 - 52 % Final  . MCV 07/06/2020 99.2  80.0 - 100.0 fL Final  . MCH 07/06/2020 33.2  26.0 - 34.0 pg Final  . MCHC 07/06/2020 33.5  30.0 - 36.0 g/dL Final  . RDW 83/38/2505 14.1  11.5 - 15.5 % Final  . Platelets 07/06/2020 269  150 - 400 K/uL Final  . nRBC 07/06/2020 0.0  0.0 - 0.2 % Final  . Neutrophils Relative % 07/06/2020 60  % Final  . Neutro Abs 07/06/2020 3.4  1.7 - 7.7 K/uL Final  . Lymphocytes Relative 07/06/2020 29  % Final  . Lymphs Abs 07/06/2020 1.7  0.7 - 4.0 K/uL Final  . Monocytes Relative 07/06/2020 9  % Final  . Monocytes Absolute 07/06/2020 0.5  0.1 - 1.0 K/uL Final  . Eosinophils Relative 07/06/2020 1  % Final  . Eosinophils Absolute 07/06/2020 0.1  0.0 - 0.5 K/uL Final  . Basophils Relative 07/06/2020 1  % Final  . Basophils Absolute 07/06/2020 0.0  0.0 - 0.1 K/uL Final  . Immature Granulocytes 07/06/2020 0  % Final  . Abs Immature Granulocytes 07/06/2020 0.01  0.00 - 0.07 K/uL Final   Performed at [facility], [address], [place], [place] [address]    Allergies: Patient has no known allergies.  PTA Medications: (Not in a hospital admission)   Medical Decision Making    Recommendations  Based on my evaluation the patient does not appear to have an emergency medical condition.  Gillermo Murdoch, NP 07/13/20  11:33 PM

## 2020-07-14 DIAGNOSIS — F1994 Other psychoactive substance use, unspecified with psychoactive substance-induced mood disorder: Secondary | ICD-10-CM | POA: Diagnosis not present

## 2020-07-14 DIAGNOSIS — R454 Irritability and anger: Secondary | ICD-10-CM | POA: Diagnosis not present

## 2020-07-14 DIAGNOSIS — F1094 Alcohol use, unspecified with alcohol-induced mood disorder: Secondary | ICD-10-CM | POA: Diagnosis not present

## 2020-07-14 DIAGNOSIS — F32A Depression, unspecified: Secondary | ICD-10-CM | POA: Diagnosis not present

## 2020-07-14 LAB — COMPREHENSIVE METABOLIC PANEL
ALT: 22 U/L (ref 0–44)
AST: 33 U/L (ref 15–41)
Albumin: 4 g/dL (ref 3.5–5.0)
Alkaline Phosphatase: 72 U/L (ref 38–126)
Anion gap: 13 (ref 5–15)
BUN: 17 mg/dL (ref 6–20)
CO2: 25 mmol/L (ref 22–32)
Calcium: 9.3 mg/dL (ref 8.9–10.3)
Chloride: 99 mmol/L (ref 98–111)
Creatinine, Ser: 1.07 mg/dL (ref 0.61–1.24)
GFR, Estimated: >60 mL/min (ref >60)
Glucose, Bld: 80 mg/dL (ref 70–99)
Potassium: 3.7 mmol/L (ref 3.5–5.1)
Sodium: 137 mmol/L (ref 135–145)
Total Bilirubin: 0.6 mg/dL (ref 0.3–1.2)
Total Protein: 6.5 g/dL (ref 6.5–8.1)

## 2020-07-14 LAB — ETHANOL: Alcohol, Ethyl (B): <10 mg/dL (ref <10)

## 2020-07-14 LAB — CBC WITH DIFFERENTIAL/PLATELET
Abs Immature Granulocytes: 0 10*3/uL (ref 0.00–0.07)
Basophils Absolute: 0.1 10*3/uL (ref 0.0–0.1)
Basophils Relative: 1 %
Eosinophils Absolute: 0.3 10*3/uL (ref 0.0–0.5)
Eosinophils Relative: 4 %
HCT: 40 % (ref 39.0–52.0)
Hemoglobin: 13.3 g/dL (ref 13.0–17.0)
Immature Granulocytes: 0 %
Lymphocytes Relative: 44 %
Lymphs Abs: 2.5 10*3/uL (ref 0.7–4.0)
MCH: 32.4 pg (ref 26.0–34.0)
MCHC: 33.3 g/dL (ref 30.0–36.0)
MCV: 97.6 fL (ref 80.0–100.0)
Monocytes Absolute: 0.5 10*3/uL (ref 0.1–1.0)
Monocytes Relative: 9 %
Neutro Abs: 2.4 10*3/uL (ref 1.7–7.7)
Neutrophils Relative %: 42 %
Platelets: 288 10*3/uL (ref 150–400)
RBC: 4.1 MIL/uL — ABNORMAL LOW (ref 4.22–5.81)
RDW: 13.8 % (ref 11.5–15.5)
WBC: 5.8 10*3/uL (ref 4.0–10.5)
nRBC: 0 % (ref 0.0–0.2)

## 2020-07-14 LAB — CBG MONITORING, ED: Amended Report: 93

## 2020-07-14 LAB — LIPID PANEL
Cholesterol: 152 mg/dL (ref 0–200)
HDL: 83 mg/dL (ref >40)
LDL Cholesterol: 49 mg/dL (ref 0–99)
Total CHOL/HDL Ratio: 1.8 RATIO
Triglycerides: 101 mg/dL (ref <150)
VLDL: 20 mg/dL (ref 0–40)

## 2020-07-14 LAB — HEMOGLOBIN A1C
Hgb A1c MFr Bld: 5.7 % — ABNORMAL HIGH (ref 4.8–5.6)
Mean Plasma Glucose: 116.89 mg/dL

## 2020-07-14 LAB — MAGNESIUM: Magnesium: 2.3 mg/dL (ref 1.7–2.4)

## 2020-07-14 LAB — TSH: TSH: 1.761 u[IU]/mL (ref 0.350–4.500)

## 2020-07-14 LAB — POC SARS CORONAVIRUS 2 AG -  ED: SARS Coronavirus 2 Ag: NEGATIVE

## 2020-07-14 LAB — RESPIRATORY PANEL BY RT PCR (FLU A&B, COVID)
Influenza A by PCR: NEGATIVE
Influenza B by PCR: NEGATIVE
SARS Coronavirus 2 by RT PCR: NEGATIVE

## 2020-07-14 LAB — GLUCOSE, CAPILLARY: Glucose-Capillary: 93 mg/dL (ref 70–99)

## 2020-07-14 NOTE — ED Notes (Signed)
Pt sleeping@this time. Breathing even and unlabored will continue to monitor for safety 

## 2020-07-14 NOTE — ED Notes (Signed)
Pt is extremely agitated and wanting to leave.  He is cursing and stating that this was a waste of his time.  He is stating that he has to hurt himself or someone else just to get help.

## 2020-07-14 NOTE — Progress Notes (Signed)
Christian Williams received his AVS, questions answered and retrieved  his personal belongings. He was given lunch and escorted to the lobby where he made a phone call. He was given a bus pass.

## 2020-07-14 NOTE — ED Notes (Signed)
PATIENT BELONGINGS: 1 JEANS, A PAIR OF SANDALS, CUP, PLASTIC BAG, ANS PERSONAL SUPPLIES ARE STORED IN LOCKER#13

## 2020-07-14 NOTE — BH Assessment (Signed)
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 He shared he had been experiencing thoughts of hurting himself (though denied plans to kill himself) and thoughts of harming someone else 3 months ago (though denied plans to harm someone at this time). He shares he does not currently have a therapist or a psychiatrist at this time.  When clinician entered the room to meet with pt, pt had pushed two of the chairs in the room together and was lying across them asleep. Clinician attempted to wake pt for the assessment and pt was able to verify his name and his DOB. When clinician asked why pt decided to come in tonight, pt stated he has been experiencing "weird thoughts" but was unable to elaborate. Pt was drowsy throughout this conversation and was difficult to keep awake. Clinician offered to come back later when pt was more awake and pt agreed to this. However, when this was attempted by the NP at a later time, pt became irate and back-talked and swore at the NP, so it was determined that pt could stay overnight at the [facility], based on the answers he provided when he checked in, and that he could be re-assessed in the morning to determine what would best meet his needs at that time.  Pt's protective factors are UTA at this time.  Pt was unable to give verbal consent for clinician to contact friends/family members to gather collateral information.  Pt's orientation and memory is UTA. Pt was unable to participate in the assessment at this time. Pt's  insight, judgement, and impulse control is UTA at this time.   Recommendations for Services/Supports/Treatments: Elenore Paddy, NP, reviewed pt's chart and information and determined pt should be observed overnight for safety and stability and be re-assessed in the morning by psychiatry.   Patient Reported Information How did you hear about [place]? Self  Referral name: Self  Referral phone number: No data recorded  Whom do you see for routine medical problems? I don't have a doctor  Practice/Facility Name: No data recorded Practice/Facility Phone Number: No data recorded Name of Contact: No data recorded Contact Number: No data recorded Contact Fax Number: No data recorded Prescriber Name: No data recorded Prescriber Address (if known): No data recorded  What Is the Reason for Your Visit/Call Today? "My cry for help of self-comfort."  How Long Has This Been Causing You Problems? > than 6 months  What Do You Feel Would Help You the Most Today? Therapy;Medication   Have You Recently Been in Any Inpatient Treatment (Hospital/Detox/Crisis Center/28-Day Program)? No  Name/Location of Program/Hospital:No data recorded How Long Were You There? No data recorded When Were You Discharged? No data recorded  Have You Ever Received Services From [facility] Before? No  Who Do You See at [facility]? No data recorded  Have You Recently Had Any Thoughts About Hurting Yourself? Yes  Are You Planning to Commit Suicide/Harm Yourself At This time? No   Have you Recently Had Thoughts About Hurting Someone Karolee Ohs? Yes  Explanation: No data recorded  Have You Used Any Alcohol  or Drugs in the Past 24 Hours? Yes  How Long Ago Did You Use Drugs or Alcohol? No data recorded What Did You Use and How Much? Marijuana (2 grams) and beer (2 16 ounce beverages)   Do You Currently Have a Therapist/Psychiatrist? No  Name of Therapist/Psychiatrist: No data recorded  Have You Been Recently  Discharged From Any Office Practice or Programs? No  Explanation of Discharge From Practice/Program: No data recorded    CCA Screening Triage Referral Assessment Type of Contact: Face-to-Face  Is this Initial or Reassessment? No data recorded Date Telepsych consult ordered in [facility]:  No data recorded Time Telepsych consult ordered in [facility]:  No data recorded  Patient Reported Information Reviewed? Yes  Patient Left Without Being Seen? No data recorded Reason for Not Completing Assessment: No data recorded  Collateral Involvement: Pt declined to provide verbal consent for clinician to make contact with friends/family for collateral information.   Does Patient Have a Automotive engineer Guardian? No data recorded Name and Contact of Legal Guardian: No data recorded If Minor and Not Living with Parent(s), Who has Custody? N/A  Is CPS involved or ever been involved? No data recorded Is APS involved or ever been involved? No data recorded  Patient Determined To Be At Risk for Harm To Self or Others Based on Review of Patient Reported Information or Presenting Complaint? No  Method: No data recorded Availability of Means: No data recorded Intent: No data recorded Notification Required: No data recorded Additional Information for Danger to Others Potential: No data recorded Additional Comments for Danger to Others Potential: No data recorded Are There Guns or Other Weapons in Your Home? No data recorded Types of Guns/Weapons: No data recorded Are These Weapons Safely Secured?                            No data recorded Who Could Verify You Are Able To Have These Secured: No data recorded Do You Have any Outstanding Charges, Pending Court Dates, Parole/Probation? No data recorded Contacted To Inform of Risk of Harm To Self or Others: No data recorded  Location of Assessment: [facility]   Does Patient Present under Involuntary Commitment? No  IVC Papers Initial  File Date: No data recorded  [place] of Residence: [place]   Patient Currently Receiving the Following Services: Not Receiving Services   Determination of Need: Emergent (2 hours)   Options For Referral: [facility]     CCA Biopsychosocial  Intake/Chief Complaint:  CCA Intake With Chief Complaint Chief Complaint/Presenting Problem: "My cry for help of self-comfort." Patient's Currently Reported Symptoms/Problems: Pt states he's experiencing "weird thoughts." Individual's Strengths: UTA Individual's Preferences: Pt expressed a desire for therapy and medication management. Individual's Abilities: UTA Type of Services Patient Feels Are Needed: Pt would like to receive therapy and medication management services. Initial Clinical Notes/Concerns: N/A  Mental Health Symptoms Depression:  Depression:  (UTA)  Mania:  Mania:  (UTA)  Anxiety:   Anxiety:  (UTA)  Psychosis:  Psychosis:  (UTA)  Trauma:  Trauma:  (UTA)  Obsessions:  Obsessions:  (UTA)  Compulsions:  Compulsions:  (UTA)  Inattention:  Inattention:  (UTA)  Hyperactivity/Impulsivity:  Hyperactivity/Impulsivity:  (UTA)  Oppositional/Defiant Behaviors:  Oppositional/Defiant Behaviors:  (UTA)  Emotional Irregularity:  Emotional Irregularity:  (UTA)  Other Mood/Personality Symptoms:  Other Mood/Personality Symptoms: UTA   Mental Status Exam Appearance and self-care  Stature:  Stature: Average  Weight:  Weight: Average weight  Clothing:  Clothing: Disheveled  Grooming:  Grooming: Normal  Cosmetic use:  Cosmetic Use: None  Posture/gait:  Posture/Gait: Other (Comment) (Pt had put two chairs together and was lying across them.)  Motor activity:  Motor Activity: Not Remarkable  Sensorium  Attention:  Attention: Unaware  Concentration:  Concentration:  (Pt was not concentrating)  Orientation:  Orientation:  (UTA)  Recall/memory:  Recall/Memory:  (UTA)  Affect and Mood  Affect:  Affect:  (UTA)  Mood:  Mood:  (UTA)   Relating  Eye contact:  Eye Contact:  (UTA)  Facial expression:  Facial Expression:  (UTA)  Attitude toward examiner:  Attitude Toward Examiner: Uninterested (UTA - pt was sleepy and unable to actively participate)  Thought and Language  Speech flow: Speech Flow:  (UTA - pt did not actively participate in the assessment)  Thought content:  Thought Content:  (UTA)  Preoccupation:  Preoccupations:  (UTA)  Hallucinations:  Hallucinations:  (UTA)  Organization:     Company secretary of Knowledge:  Fund of Knowledge:  Industrial/product designer)  Intelligence:  Intelligence:  Industrial/product designer)  Abstraction:  Abstraction:  Industrial/product designer)  Judgement:  Judgement:  Industrial/product designer)  Reality Testing:  Reality Testing:  Industrial/product designer)  Insight:  Insight:  Industrial/product designer)  Decision Making:  Decision Making:  Industrial/product designer)  Social Functioning  Social Maturity:  Social Maturity:  Industrial/product designer)  Social Judgement:  Social Judgement:  (UTA)  Stress  Stressors:  Stressors:  (UTA)  Coping Ability:  Coping Ability:  Industrial/product designer)  Skill Deficits:  Skill Deficits:  Industrial/product designer)  Supports:  Supports:  Industrial/product designer)     Religion: Religion/Spirituality Are You A Religious Person?:  (UTA) How Might This Affect Treatment?: UTA  Leisure/Recreation: Leisure / Recreation Do You Have Hobbies?:  (UTA)  Exercise/Diet: Exercise/Diet Do You Exercise?:  (UTA) Have You Gained or Lost A Significant Amount of Weight in the Past Six Months?:  (UTA) Do You Follow a Special Diet?:  (UTA) Do You Have Any Trouble Sleeping?:  (UTA)   CCA Employment/Education  Employment/Work Situation: Employment / Work Situation Employment situation:  Industrial/product designer) Patient's job has been impacted by current illness:  (N/A) What is the longest time patient has a held a job?: N/A Where was the patient employed at that time?: N/A Has patient ever been in the [employer]?:  Industrial/product designer)  Education: Education Is Patient Currently Attending School?:  (UTA) Last Grade Completed:  (N/A) Name of High School: N/A Did Garment/textile technologist From [employer]?:  (N/A) Did You Attend College?:  (N/A) Did You Attend Graduate School?:  (N/A) Did You Have Any Special Interests In School?: N/A Did You Have An Individualized Education Program (IIEP):  (N/A) Did You Have Any Difficulty At School?:  (N/A) Patient's Education Has Been Impacted by Current Illness:  (N/A)   CCA Family/Childhood History  Family and Relationship History: Family history Marital status:  (N/A) Are you sexually active?:  (N/A) What is your sexual orientation?: N/A Has your sexual activity been affected by drugs, alcohol, medication, or emotional stress?: N/A Does patient have children?:  (N/A)  Childhood History:  Childhood History By whom was/is the patient raised?:  (N/A) Additional childhood history information: N/A Description of patient's relationship with caregiver when they were a child: N/A Patient's description of current relationship with people who raised him/her: N/A How were you disciplined when you got in trouble as a child/adolescent?: N/A Does patient have siblings?:  (N/A) Did patient suffer any verbal/emotional/physical/sexual abuse as a child?:  (UTA)  Did patient suffer from severe childhood neglect?:  (UTA) Has patient ever been sexually abused/assaulted/raped as an adolescent or adult?:  (UTA) Was the patient ever a victim of a crime or a disaster?:  (UTA) Witnessed domestic violence?:  (UTA) Has patient been affected by domestic violence as an adult?:  Industrial/product designer(UTA)  Child/Adolescent Assessment:     CCA Substance Use  Alcohol/Drug Use: Alcohol / Drug Use Pain Medications: Please see MAR Prescriptions: Please see MAR Over the Counter: Please see MAR History of alcohol / drug use?: Yes Longest period of sobriety (when/how long): Unknown Substance #1 Name of Substance 1: Marijuana 1 - Age of First Use: Unknown 1 - Amount (size/oz): Unknown 1 - Frequency: Unknown 1 - Duration: Unknown 1 - Last Use / Amount: Used 2 grams  tonight Substance #2 Name of Substance 2: EtOH 2 - Age of First Use: Unknown 2 - Amount (size/oz): Unknown 2 - Frequency: Unknown 2 - Duration: Unknown 2 - Last Use / Amount: Drank 2 16-ounce beers tonight                     ASAM's:  Six Dimensions of Multidimensional Assessment  Dimension 1:  Acute Intoxication and/or Withdrawal Potential:      Dimension 2:  Biomedical Conditions and Complications:      Dimension 3:  Emotional, Behavioral, or Cognitive Conditions and Complications:     Dimension 4:  Readiness to Change:     Dimension 5:  Relapse, Continued use, or Continued Problem Potential:     Dimension 6:  Recovery/Living Environment:     ASAM Severity Score:    ASAM Recommended Level of Treatment:     Substance use Disorder (SUD)    Recommendations for Services/Supports/Treatments: Elenore PaddyJackie Thompson, NP, reviewed pt's chart and information and determined pt should be observed overnight for safety and stability and be re-assessed in the morning by psychiatry.   DSM5 Diagnoses: Patient Active Problem List   Diagnosis Date Noted  . 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

## 2020-07-14 NOTE — Discharge Instructions (Addendum)
Please come [facility]this facility) during walk in hours for appointment with psychiatrist/therapist to establish care.  Walk in hours are 8-11 AM Monday through Thursday. There is often a wait, and it is best to arrive by 7:30 AM.   Address:  [address] in [place], [address] Ph: [phone]

## 2020-07-14 NOTE — ED Provider Notes (Signed)
[facility] ASAP Discharge Summary  Date and Time: 07/14/2020 12:22 PM  Name: Christian Williams  MRN:  [id number]   Discharge Diagnoses:  Final diagnoses:  Substance induced mood disorder (HCC)    Subjective: Chart reviewed. Patient interviewed bedside. He is irritable but cooperative. Pt TP is linear. He states that he came to get help with "my thoughts and my depression". He states that he has been on medication in the past but that he did not find it helpful and that it "made it worse". He reports previous medications as "vistaril, tramadol, benadryl". When asked to elaborate on his depression he states "I'm overloaded with stress". Patient needed prompting to provide any detail regarding his symptoms, when prompted further, he states "life and people". Pt denied active SI but admits to passive SI without intent or plan at times but volunteers he wouldn't harm himself. Denied HI.  Denied AVH currently but states that he may have been experiencing AH last night, but he is unsure of they were his own thoughts. Pt states that he is originally from [place] and [place] but intends to stay in [place]. He states that he has had trouble finding a job because he has been trying to contact the social security office about getting a replacement social security card but that this has been slow d/t covid but states that his brother is assisting him with this. Patient states he smokes marijuana daily and that it is the only thing that helps with sleep, reports intermittent cocaine use. Pt minimizes substance use and does not believe it is a problem. When discussing resources with patient, he declines substance use resources because he does not believe substance use is  a problem. He declined homeless resources and stated the does not like to stay at shelters and describes having poor frustration tolerance and "getting angry" easily at others at the shelter. When asked what he needs help with  he states he wants to get back on medication. While discussing walk in appointnments, patient states that he doesn't like talking to people and "won't walk in". Discussed with patient that he had expressed interest in medication and therapy which is best done for a walk in appointment. At this time, patient becomes agitated and called me a "bitch" and says that he shouldn't have come here since we are not offering him him help despite providing him with resources to obtain help. Informed patient of discharge and that walk in resources will be provided in his discharge paperwork.  Stay Summary: Patient presented as a walk in on 10/31, reporting worsening depression that he had hid his whole life. He reported intermittent SI. On initial assessment by NP Janee Mornhompson, patient was irritable and using expletive words at provider. Reported passive SI without plan or intent. UDS+cocaine and THC. This AM patient remained irritable. He had presented requesting help; per documentation was requesting medication management and therapy. Pt did not meet criteria for inpatient admission (see above; no imminent risk to self or others ) and when discussing resources available pt become angry and responded with foul language. He minimized substance use and declined help. He is homeless but declined homeless resources.   Pt has refused to participate fully in evaluations with both the overnight NP and myself. He gives vague answers about his  suicidality as well as his depression, and when pressed for specifics, he becomes irritable and defensive. Supportive psychotherapy and suicide risk reduction techniques were attempted, but the pt  refused to engage. This is not consistent with someone who presents to the ER hospital claiming to want help for SI. Patient stated that he was interested in therapy and medication management, but on my evaluation was he refuses to entertain any outpatient treatment options. Unclear if secondary gain is  a motive, as patient is currently homeless and/or if personality pathology is at play     Total Time spent with patient: 20 minutes  Past Psychiatric History: see H &P Past Medical History: History reviewed. No pertinent past medical history.  Past Surgical History:  Procedure Laterality Date  . MANDIBLE FRACTURE SURGERY     Family History: History reviewed. No pertinent family history. Family Psychiatric History: see H&P Social History:  Social History   Substance and Sexual Activity  Alcohol Use Not Currently     Social History   Substance and Sexual Activity  Drug Use Yes  . Types: Marijuana    Social History   Socioeconomic History  . Marital status: Single    Spouse name: Not on file  . Number of children: Not on file  . Years of education: Not on file  . Highest education level: Not on file  Occupational History  . Not on file  Tobacco Use  . Smoking status: Current Some Day Smoker    Packs/day: 0.25    Types: Cigarettes  . Smokeless tobacco: Never Used  Vaping Use  . Vaping Use: Never used  Substance and Sexual Activity  . Alcohol use: Not Currently  . Drug use: Yes    Types: Marijuana  . Sexual activity: Not on file  Other Topics Concern  . Not on file  Social History Narrative  . Not on file   Social Determinants of Health   Financial Resource Strain:   . Difficulty of Paying Living Expenses: Not on file  Food Insecurity:   . Worried About Programme researcher, broadcasting/film/video in the Last Year: Not on file  . Ran Out of Food in the Last Year: Not on file  Transportation Needs:   . Lack of Transportation (Medical): Not on file  . Lack of Transportation (Non-Medical): Not on file  Physical Activity:   . Days of Exercise per Week: Not on file  . Minutes of Exercise per Session: Not on file  Stress:   . Feeling of Stress : Not on file  Social Connections:   . Frequency of Communication with Friends and Family: Not on file  . Frequency of Social Gatherings with  Friends and Family: Not on file  . Attends Religious Services: Not on file  . Active Member of Clubs or Organizations: Not on file  . Attends Banker Meetings: Not on file  . Marital Status: Not on file   SDOH:  SDOH Screenings   Alcohol Screen:   . Last Alcohol Screening Score (AUDIT): Not on file  Depression (PHQ2-9):   . PHQ-2 Score: Not on file  Financial Resource Strain:   . Difficulty of Paying Living Expenses: Not on file  Food Insecurity:   . Worried About Programme researcher, broadcasting/film/video in the Last Year: Not on file  . Ran Out of Food in the Last Year: Not on file  Housing:   . Last Housing Risk Score: Not on file  Physical Activity:   . Days of Exercise per Week: Not on file  . Minutes of Exercise per Session: Not on file  Social Connections:   . Frequency of Communication with Friends and  Family: Not on file  . Frequency of Social Gatherings with Friends and Family: Not on file  . Attends Religious Services: Not on file  . Active Member of Clubs or Organizations: Not on file  . Attends Banker Meetings: Not on file  . Marital Status: Not on file  Stress:   . Feeling of Stress : Not on file  Tobacco Use: High Risk  . Smoking Tobacco Use: Current Some Day Smoker  . Smokeless Tobacco Use: Never Used  Transportation Needs:   . Freight forwarder (Medical): Not on file  . Lack of Transportation (Non-Medical): Not on file    Has this patient used any form of tobacco in the last 30 days? (Cigarettes, Smokeless Tobacco, Cigars, and/or Pipes) Prescription not provided because: n.a  Current Medications:  Current Facility-Administered Medications  Medication Dose Route Frequency Provider Last Rate Last Admin  . acetaminophen (TYLENOL) tablet 650 mg  650 mg Oral Q6H PRN Gillermo Murdoch, NP      . alum & mag hydroxide-simeth (MAALOX/MYLANTA) 200-200-20 MG/5ML suspension 30 mL  30 mL Oral Q4H PRN Gillermo Murdoch, NP      . magnesium hydroxide  (MILK OF MAGNESIA) suspension 30 mL  30 mL Oral Daily PRN Gillermo Murdoch, NP      . traZODone (DESYREL) tablet 50 mg  50 mg Oral QHS PRN Gillermo Murdoch, NP       No current outpatient medications on file.    PTA Medications: (Not in a hospital admission)   Musculoskeletal  Strength & Muscle Tone: within normal limits Gait & Station: normal Patient leans: N/A  Psychiatric Specialty Exam  Presentation  General Appearance: Appropriate for Environment;Disheveled  Eye Contact:Poor  Speech:Clear and Coherent;Normal Rate  Speech Volume:Normal  Handedness:Left   Mood and Affect  Mood:Dysphoric  Affect:Blunt;Other (comment) (irritable)   Thought Process  Thought Processes:Goal Directed;Linear  Descriptions of Associations:Intact  Orientation:Full (Time, Place and Person)  Thought Content:WDL  Hallucinations:Hallucinations: None  Ideas of Reference:None  Suicidal Thoughts:Suicidal Thoughts: No SI Passive Intent and/or Plan: Without Intent;Without Plan  Homicidal Thoughts:Homicidal Thoughts: No   Sensorium  Memory:Immediate Fair;Recent Fair  Judgment:Intact  Insight:Other (comment) (limited)   Executive Functions  Concentration:Fair  Attention Span:Fair  Recall:Fair  [employer] of Knowledge:Fair  Language:Good   Psychomotor Activity  Psychomotor Activity:Psychomotor Activity: Normal   Assets  Assets:Communication Skills;Desire for Improvement;Resilience   Sleep  Sleep:Sleep: Fair   Physical Exam  Physical Exam Constitutional:      Appearance: Normal appearance. He is normal weight.  HENT:     Head: Normocephalic and atraumatic.  Eyes:     Extraocular Movements: Extraocular movements intact.  Pulmonary:     Effort: Pulmonary effort is normal.  Neurological:     Mental Status: He is alert.    Review of Systems  Psychiatric/Behavioral: Positive for depression and substance abuse. Negative for hallucinations.   Blood pressure  117/75, pulse 92, temperature 97.8 F (36.6 C), temperature source Temporal, resp. rate 17, height 5' 4.96" (1.65 m), weight 63.5 kg, SpO2 98 %. Body mass index is 23.33 kg/m.  Demographic Factors:  Male, Low socioeconomic status and Unemployed  Loss Factors: NA  Historical Factors: Impulsivity  Risk Reduction Factors:   Positive social support  Continued Clinical Symptoms:  Alcohol/Substance Abuse/Dependencies Previous Psychiatric Diagnoses and Treatments  Cognitive Features That Contribute To Risk:  Closed-mindedness   , poor frustration tolerance   Suicide Risk:  Minimal: No identifiable suicidal ideation.  Patients presenting with no risk factors but  with morbid ruminations; may be classified as minimal risk based on the severity of the depressive symptoms  Plan Of Care/Follow-up recommendations:  Activity:  as tolerated Diet:  regular Other:      Patient has been instructed & cautioned: To not engage in alcohol and or illegal drug use while on prescription medicines. In the event of worsening symptoms, patient is instructed to call the crisis hotline, 911 and or go to the nearest ED for appropriate evaluation and treatment of symptoms. To follow-up with his/her primary care provider for your other medical issues, concerns and or health care needs. Provided with walk information at the  [facility] and encouraged to follow up      Disposition: self care  Estella Husk, MD 07/14/2020, 12:22 PM

## 2020-07-15 LAB — PROLACTIN: Prolactin: 9.8 ng/mL (ref 4.0–15.2)

## 2020-08-17 ENCOUNTER — Encounter (HOSPITAL_COMMUNITY): Payer: Self-pay | Admitting: Emergency Medicine

## 2020-08-17 ENCOUNTER — Other Ambulatory Visit: Payer: Self-pay

## 2020-08-17 ENCOUNTER — Ambulatory Visit (HOSPITAL_COMMUNITY)
Admission: EM | Admit: 2020-08-17 | Discharge: 2020-08-18 | Disposition: A | Discharge status: Home / Self Care | Payer: No Payment, Other | Attending: Family | Admitting: Family

## 2020-08-17 DIAGNOSIS — F1994 Other psychoactive substance use, unspecified with psychoactive substance-induced mood disorder: Secondary | ICD-10-CM

## 2020-08-17 DIAGNOSIS — F1410 Cocaine abuse, uncomplicated: Secondary | ICD-10-CM

## 2020-08-17 DIAGNOSIS — F1914 Other psychoactive substance abuse with psychoactive substance-induced mood disorder: Secondary | ICD-10-CM | POA: Insufficient documentation

## 2020-08-17 DIAGNOSIS — F17210 Nicotine dependence, cigarettes, uncomplicated: Secondary | ICD-10-CM | POA: Insufficient documentation

## 2020-08-17 DIAGNOSIS — F29 Unspecified psychosis not due to a substance or known physiological condition: Secondary | ICD-10-CM

## 2020-08-17 DIAGNOSIS — Z20822 Contact with and (suspected) exposure to covid-19: Secondary | ICD-10-CM | POA: Insufficient documentation

## 2020-08-17 DIAGNOSIS — F1910 Other psychoactive substance abuse, uncomplicated: Secondary | ICD-10-CM

## 2020-08-17 DIAGNOSIS — F1014 Alcohol abuse with alcohol-induced mood disorder: Secondary | ICD-10-CM | POA: Diagnosis not present

## 2020-08-17 DIAGNOSIS — F1290 Cannabis use, unspecified, uncomplicated: Secondary | ICD-10-CM

## 2020-08-17 DIAGNOSIS — F19950 Other psychoactive substance use, unspecified with psychoactive substance-induced psychotic disorder with delusions: Secondary | ICD-10-CM | POA: Insufficient documentation

## 2020-08-17 DIAGNOSIS — F14259 Cocaine dependence with cocaine-induced psychotic disorder, unspecified: Secondary | ICD-10-CM | POA: Insufficient documentation

## 2020-08-17 DIAGNOSIS — Z5900 Homelessness unspecified: Secondary | ICD-10-CM

## 2020-08-17 LAB — CBC WITH DIFFERENTIAL/PLATELET
Abs Immature Granulocytes: 0.01 10*3/uL (ref 0.00–0.07)
Basophils Absolute: 0.1 10*3/uL (ref 0.0–0.1)
Basophils Relative: 1 %
Eosinophils Absolute: 0.2 10*3/uL (ref 0.0–0.5)
Eosinophils Relative: 3 %
HCT: 40.4 % (ref 39.0–52.0)
Hemoglobin: 13.5 g/dL (ref 13.0–17.0)
Immature Granulocytes: 0 %
Lymphocytes Relative: 38 %
Lymphs Abs: 2.2 10*3/uL (ref 0.7–4.0)
MCH: 32.5 pg (ref 26.0–34.0)
MCHC: 33.4 g/dL (ref 30.0–36.0)
MCV: 97.1 fL (ref 80.0–100.0)
Monocytes Absolute: 0.5 10*3/uL (ref 0.1–1.0)
Monocytes Relative: 8 %
Neutro Abs: 2.8 10*3/uL (ref 1.7–7.7)
Neutrophils Relative %: 50 %
Platelets: 304 10*3/uL (ref 150–400)
RBC: 4.16 MIL/uL — ABNORMAL LOW (ref 4.22–5.81)
RDW: 13.2 % (ref 11.5–15.5)
WBC: 5.7 10*3/uL (ref 4.0–10.5)
nRBC: 0 % (ref 0.0–0.2)

## 2020-08-17 LAB — LIPID PANEL
Cholesterol: 168 mg/dL (ref 0–200)
HDL: 107 mg/dL (ref >40)
LDL Cholesterol: 45 mg/dL (ref 0–99)
Total CHOL/HDL Ratio: 1.6 RATIO
Triglycerides: 78 mg/dL (ref <150)
VLDL: 16 mg/dL (ref 0–40)

## 2020-08-17 LAB — COMPREHENSIVE METABOLIC PANEL
ALT: 27 U/L (ref 0–44)
AST: 39 U/L (ref 15–41)
Albumin: 4.4 g/dL (ref 3.5–5.0)
Alkaline Phosphatase: 49 U/L (ref 38–126)
Anion gap: 12 (ref 5–15)
BUN: 20 mg/dL (ref 6–20)
CO2: 27 mmol/L (ref 22–32)
Calcium: 9.6 mg/dL (ref 8.9–10.3)
Chloride: 102 mmol/L (ref 98–111)
Creatinine, Ser: 1.1 mg/dL (ref 0.61–1.24)
GFR, Estimated: >60 mL/min (ref >60)
Glucose, Bld: 100 mg/dL — ABNORMAL HIGH (ref 70–99)
Potassium: 3.4 mmol/L — ABNORMAL LOW (ref 3.5–5.1)
Sodium: 141 mmol/L (ref 135–145)
Total Bilirubin: 0.8 mg/dL (ref 0.3–1.2)
Total Protein: 6.9 g/dL (ref 6.5–8.1)

## 2020-08-17 LAB — HEMOGLOBIN A1C
Hgb A1c MFr Bld: 5.6 % (ref 4.8–5.6)
Mean Plasma Glucose: 114.02 mg/dL

## 2020-08-17 LAB — RESP PANEL BY RT-PCR (FLU A&B, COVID) ARPGX2
Influenza A by PCR: NEGATIVE
Influenza B by PCR: NEGATIVE
SARS Coronavirus 2 by RT PCR: NEGATIVE

## 2020-08-17 LAB — POC SARS CORONAVIRUS 2 AG: SARS Coronavirus 2 Ag: NEGATIVE

## 2020-08-17 LAB — POCT URINE DRUG SCREEN - MANUAL ENTRY (I-SCREEN)
POC Amphetamine UR: NOT DETECTED
POC Buprenorphine (BUP): NOT DETECTED
POC Cocaine UR: POSITIVE — AB
POC Marijuana UR: POSITIVE — AB
POC Methadone UR: NOT DETECTED
POC Methamphetamine UR: NOT DETECTED
POC Morphine: NOT DETECTED
POC Oxazepam (BZO): NOT DETECTED
POC Oxycodone UR: NOT DETECTED
POC Secobarbital (BAR): NOT DETECTED

## 2020-08-17 LAB — ETHANOL: Alcohol, Ethyl (B): <10 mg/dL (ref <10)

## 2020-08-17 LAB — TSH: TSH: 0.76 u[IU]/mL (ref 0.350–4.500)

## 2020-08-17 LAB — MAGNESIUM: Magnesium: 2.4 mg/dL (ref 1.7–2.4)

## 2020-08-17 LAB — POC SARS CORONAVIRUS 2 AG -  ED: SARS Coronavirus 2 Ag: NEGATIVE

## 2020-08-17 MED ORDER — MAGNESIUM HYDROXIDE 400 MG/5ML PO SUSP: 30.0000 mL | Freq: Every day | ORAL | Status: DC | PRN

## 2020-08-17 MED ORDER — ALUM & MAG HYDROXIDE-SIMETH 200-200-20 MG/5ML PO SUSP: 30.0000 mL | ORAL | Status: DC | PRN

## 2020-08-17 MED ORDER — OLANZAPINE 2.5 MG PO TABS
2.5000 mg | ORAL_TABLET | Freq: Every day | ORAL | Status: DC
  Administered 2020-08-17: 2.5 mg via ORAL
  Filled 2020-08-17: qty 1
  Filled 2020-08-17: qty 7

## 2020-08-17 MED ORDER — HYDROXYZINE HCL 25 MG PO TABS
25.0000 mg | ORAL_TABLET | Freq: Three times a day (TID) | ORAL | Status: DC | PRN
  Filled 2020-08-17: qty 10

## 2020-08-17 MED ORDER — ACETAMINOPHEN 325 MG PO TABS: 650.0000 mg | ORAL_TABLET | Freq: Four times a day (QID) | ORAL | Status: DC | PRN

## 2020-08-17 MED ORDER — TRAZODONE HCL 50 MG PO TABS
50.0000 mg | ORAL_TABLET | Freq: Every evening | ORAL | Status: DC | PRN
  Administered 2020-08-17: 50 mg via ORAL
  Filled 2020-08-17: qty 1
  Filled 2020-08-17: qty 7

## 2020-08-17 NOTE — ED Provider Notes (Signed)
Behavioral Health Admission H&P [facility] & OBS)  Date: 08/17/20 Patient Name: Christian Williams MRN: [id number] Chief Complaint:  Chief Complaint  Patient presents with  . Addiction Problem    with psychosis   Chief Complaint/Presenting Problem: ''I am in spirital warfare... I am fighting demons and fallen angels''  Diagnoses:  Final diagnoses:  Psychosis, unspecified psychosis type (HCC)  Cocaine use disorder (HCC)  Marijuana use    HPI: History of Present illness: Christian Williams is a 39 y.o. male.  Patient presents voluntarily to [facility] behavioral health center for walk-in assessment.  Patient reports he has used "and lot of cocaine and 1 g of weed today."  Patient reports "this has nothing to do with the drugs."  Patient reports he has recently been fasting in an effort to better understand "good versus evil."  Patient reports decreased sleep and appetite times several days.  Patient reports he rode a bus to [facility] so that he could "get some help."  Patient reports he resides in [place] alone.  Patient apparently is homeless states "the world is my home."  Patient denies access to weapons.  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  Patient is concerned that he may lose both of his jobs as he has not reported to work today and will be unable to report to his job at [employer].  Patient endorses cocaine and marijuana use, last use today.  Patient endorses history of alcohol use but states he has stopped using alcohol recently.  Patient denies any outpatient psychiatric follow-up currently.  Patient denies any current medications.  Patient assessed by nurse practitioner.  Patient alert and oriented, participates actively in assessment.  Patient denies suicidal and homicidal ideations.  Patient reports he would never harm himself as he has 6 children and would not hurt his children  in any way.  Patient denies auditory and visual hallucinations.  Patient presents with apparent delusional thought content.  Patient reports he is fixated on the battle of good versus evil.  Patient states "I am all about positivity."  Patient offered support and encouragement.   PHQ 2-9:    ED from 08/17/2020 in [facility]  Thoughts that you would be better off dead, or of hurting yourself in some way Nearly every day  [Phreesia 08/17/2020]  PHQ-9 Total Score 23        ED from 08/17/2020 in [facility] ED from 07/13/2020 in [facility] ED from 08/27/2018 in [facility]  C-SSRS RISK CATEGORY Moderate Risk No Risk Low Risk       Total Time spent with patient: 20 minutes  Musculoskeletal  Strength & Muscle Tone: within normal limits Gait & Station: normal Patient leans: N/A  Psychiatric Specialty Exam  Presentation General Appearance: Disheveled  Eye Contact:Minimal  Speech:Slow  Speech Volume:Normal  Handedness:Right   Mood and Affect  Mood:Labile  Affect:Non-Congruent   Thought Process  Thought Processes:Coherent  Descriptions of Associations:Tangential  Orientation:Full (Time, Place and Person)  Thought Content:Tangential  Hallucinations:Hallucinations: None  Ideas of Reference:None  Suicidal Thoughts:Suicidal Thoughts: No  Homicidal Thoughts:Homicidal Thoughts: No   Sensorium  Memory:Immediate Fair;Recent Fair;Remote Fair  Judgment:Impaired  Insight:Fair   Executive Functions  Concentration:Fair  Attention Span:Fair  Recall:Fair  Fund of Knowledge:Fair  Language:Fair   Psychomotor Activity  Psychomotor Activity:Psychomotor Activity: Normal   Assets  Assets:Communication Skills;Desire for Improvement;Physical Health;Resilience;Social Support   Sleep  Sleep:Sleep: Fair   Physical Exam Vitals and nursing  note reviewed.  Constitutional:      Appearance: He is well-developed.  HENT:     Head: Normocephalic.  Cardiovascular:     Rate and Rhythm: Normal rate.  Pulmonary:     Effort: Pulmonary effort is normal.  Neurological:     Mental Status: He is alert and oriented to person, place, and time.  Psychiatric:        Attention and Perception: Attention and perception normal.        Mood and Affect: Affect is inappropriate.        Speech: Speech normal.        Behavior: Behavior normal. Behavior is cooperative.        Thought Content: Thought content is delusional.        Cognition and Memory: Cognition and memory normal.        Judgment: Judgment is inappropriate.    Review of Systems  Constitutional: Negative.   HENT: Negative.   Eyes: Negative.   Respiratory: Negative.   Cardiovascular: Negative.   Gastrointestinal: Negative.   Genitourinary: Negative.   Musculoskeletal: Negative.   Skin: Negative.   Neurological: Negative.   Endo/Heme/Allergies: Negative.   Psychiatric/Behavioral: Positive for substance abuse.    Blood pressure 100/71, pulse 100, temperature 98.9 F (37.2 C), temperature source Tympanic, resp. rate 18, height 5\' 9"  (1.753 m), SpO2 99 %. Body mass index is 20.67 kg/m.  Past Psychiatric History: Substance-induced psychotic disorder with delusions, alcohol abuse with alcohol-induced mood disorder  Is the patient at risk to self? No  Has the patient been a risk to self in the past 6 months? No .    Has the patient been a risk to self within the distant past? No   Is the patient a risk to others? No   Has the patient been a risk to others in the past 6 months? No   Has the patient been a risk to others within the distant past? No   Past Medical History: History reviewed. No pertinent past medical history.  Past Surgical History:  Procedure Laterality Date  . MANDIBLE FRACTURE SURGERY      Family History: History reviewed. No pertinent family  history.  Social History:  Social History   Socioeconomic History  . Marital status: Single    Spouse name: Not on file  . Number of children: Not on file  . Years of education: Not on file  . Highest education level: Not on file  Occupational History  . Not on file  Tobacco Use  . Smoking status: Current Some Day Smoker    Packs/day: 0.25    Types: Cigarettes  . Smokeless tobacco: Never Used  Vaping Use  . Vaping Use: Never used  Substance and Sexual Activity  . Alcohol use: Not Currently  . Drug use: Yes    Types: Marijuana  . Sexual activity: Not on file  Other Topics Concern  . Not on file  Social History Narrative  . Not on file   Social Determinants of Health   Financial Resource Strain:   . Difficulty of Paying Living Expenses: Not on file  Food Insecurity:   . Worried About in the Last Year: Not on file  . Ran Out of Food in the Last Year: Not on file  Transportation Needs:   . Lack of Transportation (Medical): Not on file  .  Lack of Transportation (Non-Medical): Not on file  Physical Activity:   . Days of Exercise per Week: Not on file  . Minutes of Exercise per Session: Not on file  Stress:   . Feeling of Stress : Not on file  Social Connections:   . Frequency of Communication with Friends and Family: Not on file  . Frequency of Social Gatherings with Friends and Family: Not on file  . Attends Religious Services: Not on file  . Active Member of Clubs or Organizations: Not on file  . Attends Banker Meetings: Not on file  . Marital Status: Not on file  Intimate Partner Violence:   . Fear of Current or Ex-Partner: Not on file  . Emotionally Abused: Not on file  . Physically Abused: Not on file  . Sexually Abused: Not on file    SDOH:  SDOH Screenings   Alcohol Screen:   . Last Alcohol Screening Score (AUDIT): Not on file  Depression (PHQ2-9): Medium Risk  . PHQ-2 Score: 23  Financial Resource Strain:   .  Difficulty of Paying Living Expenses: Not on file  Food Insecurity:   . Worried About Programme researcher, broadcasting/film/video in the Last Year: Not on file  . Ran Out of Food in the Last Year: Not on file  Housing:   . Last Housing Risk Score: Not on file  Physical Activity:   . Days of Exercise per Week: Not on file  . Minutes of Exercise per Session: Not on file  Social Connections:   . Frequency of Communication with Friends and Family: Not on file  . Frequency of Social Gatherings with Friends and Family: Not on file  . Attends Religious Services: Not on file  . Active Member of Clubs or Organizations: Not on file  . Attends Banker Meetings: Not on file  . Marital Status: Not on file  Stress:   . Feeling of Stress : Not on file  Tobacco Use: High Risk  . Smoking Tobacco Use: Current Some Day Smoker  . Smokeless Tobacco Use: Never Used  Transportation Needs:   . Freight forwarder (Medical): Not on file  . Lack of Transportation (Non-Medical): Not on file    Last Labs:  Admission on 08/17/2020  Component Date Value Ref Range Status  . POC Amphetamine UR 08/17/2020 None Detected  None Detected Final  . POC Secobarbital (BAR) 08/17/2020 None Detected  None Detected Final  . POC Buprenorphine (BUP) 08/17/2020 None Detected  None Detected Final  . POC Oxazepam (BZO) 08/17/2020 None Detected  None Detected Final  . POC Cocaine UR 08/17/2020 Positive* None Detected Final  . POC Methamphetamine UR 08/17/2020 None Detected  None Detected Final  . POC Morphine 08/17/2020 None Detected  None Detected Final  . POC Oxycodone UR 08/17/2020 None Detected  None Detected Final  . POC Methadone UR 08/17/2020 None Detected  None Detected Final  . POC Marijuana UR 08/17/2020 Positive* None Detected Final  . SARS Coronavirus 2 Ag 08/17/2020 Negative  Negative Final  Admission on 07/13/2020, Discharged on 07/14/2020  Component Date Value Ref Range Status  . SARS Coronavirus 2 by RT PCR  07/13/2020 NEGATIVE  NEGATIVE Final   Comment: (NOTE) SARS-CoV-2 target nucleic acids are NOT DETECTED.  The SARS-CoV-2 RNA is generally detectable in upper respiratoy specimens during the acute phase of infection. The lowest concentration of SARS-CoV-2 viral copies this assay can detect is 131 copies/mL. A negative result does not preclude SARS-Cov-2 infection  and should not be used as the sole basis for treatment or other patient management decisions. A negative result may occur with  improper specimen collection/handling, submission of specimen other than nasopharyngeal swab, presence of viral mutation(s) within the areas targeted by this assay, and inadequate number of viral copies (<131 copies/mL). A negative result must be combined with clinical observations, patient history, and epidemiological information. The expected result is Negative.  Fact Sheet for Patients:  https://www.moore.com/  Fact Sheet for Healthcare Providers:  https://www.young.biz/  This test is no                          t yet approved or cleared by the [place] FDA and  has been authorized for detection and/or diagnosis of SARS-CoV-2 by FDA under an Emergency Use Authorization (EUA). This EUA will remain  in effect (meaning this test can be used) for the duration of the COVID-19 declaration under Section 564(b)(1) of the Act, 21 U.S.C. section 360bbb-3(b)(1), unless the authorization is terminated or revoked sooner.    . Influenza A by PCR 07/13/2020 NEGATIVE  NEGATIVE Final  . Influenza B by PCR 07/13/2020 NEGATIVE  NEGATIVE Final   Comment: (NOTE) The Xpert Xpress SARS-CoV-2/FLU/RSV assay is intended as an aid in  the diagnosis of influenza from Nasopharyngeal swab specimens and  should not be used as a sole basis for treatment. Nasal washings and  aspirates are unacceptable for Xpert Xpress SARS-CoV-2/FLU/RSV  testing.  Fact Sheet for  Patients: https://www.moore.com/  Fact Sheet for Healthcare Providers: https://www.young.biz/  This test is not yet approved or cleared by the [place] FDA and  has been authorized for detection and/or diagnosis of SARS-CoV-2 by  FDA under an Emergency Use Authorization (EUA). This EUA will remain  in effect (meaning this test can be used) for the duration of the  Covid-19 declaration under Section 564(b)(1) of the Act, 21  U.S.C. section 360bbb-3(b)(1), unless the authorization is  terminated or revoked. Performed at [facility], [address] [place], [place] [address]   . SARS Coronavirus 2 Ag 07/13/2020 Negative  Negative Preliminary  . WBC 07/14/2020 5.8  4.0 - 10.5 K/uL Final  . RBC 07/14/2020 4.10* 4.22 - 5.81 MIL/uL Final  . Hemoglobin 07/14/2020 13.3  13.0 - 17.0 g/dL Final  . HCT 60/45/4098 40.0  39 - 52 % Final  . MCV 07/14/2020 97.6  80.0 - 100.0 fL Final  . MCH 07/14/2020 32.4  26.0 - 34.0 pg Final  . MCHC 07/14/2020 33.3  30.0 - 36.0 g/dL Final  . RDW 11/91/4782 13.8  11.5 - 15.5 % Final  . Platelets 07/14/2020 288  150 - 400 K/uL Final  . nRBC 07/14/2020 0.0  0.0 - 0.2 % Final  . Neutrophils Relative % 07/14/2020 42  % Final  . Neutro Abs 07/14/2020 2.4  1.7 - 7.7 K/uL Final  . Lymphocytes Relative 07/14/2020 44  % Final  . Lymphs Abs 07/14/2020 2.5  0.7 - 4.0 K/uL Final  . Monocytes Relative 07/14/2020 9  % Final  . Monocytes Absolute 07/14/2020 0.5  0.1 - 1.0 K/uL Final  . Eosinophils Relative 07/14/2020 4  % Final  . Eosinophils Absolute 07/14/2020 0.3  0.0 - 0.5 K/uL Final  . Basophils Relative 07/14/2020 1  % Final  . Basophils Absolute 07/14/2020 0.1  0.0 - 0.1 K/uL Final  . Immature Granulocytes 07/14/2020 0  % Final  . Abs Immature Granulocytes 07/14/2020 0.00  0.00 - 0.07 K/uL Final   Performed at [facility], [address], [place], [place] [address]  . Sodium 07/14/2020 137  135 - 145 mmol/L  Final  . Potassium 07/14/2020 3.7  3.5 - 5.1 mmol/L Final  . Chloride 07/14/2020 99  98 - 111 mmol/L Final  . CO2 07/14/2020 25  22 - 32 mmol/L Final  . Glucose, Bld 07/14/2020 80  70 - 99 mg/dL Final   Glucose reference range applies only to samples taken after fasting for at least 8 hours.  . BUN 07/14/2020 17  6 - 20 mg/dL Final  . Creatinine, Ser 07/14/2020 1.07  0.61 - 1.24 mg/dL Final  . Calcium 08/65/7846 9.3  8.9 - 10.3 mg/dL Final  . Total Protein 07/14/2020 6.5  6.5 - 8.1 g/dL Final  . Albumin 96/29/5284 4.0  3.5 - 5.0 g/dL Final  . AST 13/24/4010 33  15 - 41 U/L Final  . ALT 07/14/2020 22  0 - 44 U/L Final  . Alkaline Phosphatase 07/14/2020 72  38 - 126 U/L Final  . Total Bilirubin 07/14/2020 0.6  0.3 - 1.2 mg/dL Final  . GFR, Estimated 07/14/2020 >60  >60 mL/min Final   Comment: (NOTE) Calculated using the CKD-EPI Creatinine Equation (2021)   . Anion gap 07/14/2020 13  5 - 15 Final   Performed at [facility], [address] [place], [place] [address]  . Hgb A1c MFr Bld 07/14/2020 5.7* 4.8 - 5.6 % Final   Comment: (NOTE) Pre diabetes:          5.7%-6.4%  Diabetes:              >6.4%  Glycemic control for   <7.0% adults with diabetes   . Mean Plasma Glucose 07/14/2020 116.89  mg/dL Final   Performed at [facility], [address] [place], [place] [address]  . Magnesium 07/14/2020 2.3  1.7 - 2.4 mg/dL Final   Performed at [facility], [address], [place], [place] [address]  . Alcohol, Ethyl (B) 07/14/2020 <10  <10 mg/dL Final   Comment: (NOTE) Lowest detectable limit for serum alcohol is 10 mg/dL.  For medical purposes only. Performed at [facility], [address] [place], [place] [address]   . Amended Report 07/14/2020 93   Preliminary  . TSH 07/14/2020 1.761  0.350 - 4.500 uIU/mL Final   Comment: Performed by a 3rd Generation assay with a functional sensitivity of <=0.01 uIU/mL. Performed at [facility], [address], [place], [place] [address]   . Prolactin 07/14/2020 9.8  4.0 - 15.2 ng/mL Final   Comment: (NOTE) Performed At: [facility] [address] [place], [place] [id number] Jolene Schimke MD ZY:[id number]   . POC Amphetamine UR 07/13/2020 None Detected  None Detected Preliminary  . POC Secobarbital (BAR) 07/13/2020 None Detected  None Detected Preliminary  . POC Buprenorphine (BUP) 07/13/2020 None Detected  None Detected Preliminary  . POC Oxazepam (BZO) 07/13/2020 None Detected  None Detected Preliminary  . POC Cocaine UR 07/13/2020 Positive* None Detected Preliminary  . POC Methamphetamine UR 07/13/2020 None Detected  None Detected Preliminary  . POC Morphine 07/13/2020 None Detected  None Detected Preliminary  . POC Oxycodone UR 07/13/2020 None Detected  None Detected Preliminary  . POC Methadone UR 07/13/2020 None Detected  None Detected Preliminary  . POC Marijuana UR 07/13/2020 Positive* None Detected Preliminary  . Glucose-Capillary 07/14/2020 93  70 -  99 mg/dL Final   Glucose reference range applies only to samples taken after fasting for at least 8 hours.  . Cholesterol 07/14/2020 152  0 - 200 mg/dL Final  . Triglycerides 07/14/2020 101  <150 mg/dL Final  . HDL 16/10/960411/09/2019 83  >40 mg/dL Final  . Total CHOL/HDL Ratio 07/14/2020 1.8  RATIO Final  . VLDL 07/14/2020 20  0 - 40 mg/dL Final  . LDL Cholesterol 07/14/2020 49  0 - 99 mg/dL Final   Comment:        Total Cholesterol/HDL:CHD Risk Coronary Heart Disease Risk Table                     Men   Women  1/2 Average Risk   3.4   3.3  Average Risk       5.0   4.4  2 X Average Risk   9.6   7.1  3 X Average Risk  23.4   11.0        Use the calculated Patient Ratio above and the CHD Risk Table to determine the patient's CHD Risk.        ATP III CLASSIFICATION (LDL):  <100     mg/dL   Optimal  [id number]  mg/dL   Near or Above                    Optimal  130-159  mg/dL   Borderline  191-478160-189  mg/dL   High  >295>190     mg/dL    Very High Performed at [facility], [address] [place], [place] [address]   Admission on 07/06/2020, Discharged on 07/06/2020  Component Date Value Ref Range Status  . Color, Urine 07/06/2020 YELLOW  YELLOW Final  . APPearance 07/06/2020 HAZY* CLEAR Final  . Specific Gravity, Urine 07/06/2020 1.016  1.005 - 1.030 Final  . pH 07/06/2020 5.0  5.0 - 8.0 Final  . Glucose, UA 07/06/2020 NEGATIVE  NEGATIVE mg/dL Final  . Hgb urine dipstick 07/06/2020 SMALL* NEGATIVE Final  . Bilirubin Urine 07/06/2020 NEGATIVE  NEGATIVE Final  . Ketones, ur 07/06/2020 NEGATIVE  NEGATIVE mg/dL Final  . Protein, ur 86/57/846910/24/2021 NEGATIVE  NEGATIVE mg/dL Final  . Nitrite 62/95/284110/24/2021 NEGATIVE  NEGATIVE Final  . Glori LuisLeukocytes,Ua 07/06/2020 LARGE* NEGATIVE Final  . RBC / HPF 07/06/2020 0-5  0 - 5 RBC/hpf Final  . WBC, UA 07/06/2020 >50* 0 - 5 WBC/hpf Final  . Bacteria, UA 07/06/2020 RARE* NONE SEEN Final  . Mucus 07/06/2020 PRESENT   Final   Performed at [facility], [address] [place], [place] [address]  . Specimen Description 07/06/2020 URINE, RANDOM   Final  . Special Requests 07/06/2020 NONE   Final  . Culture 07/06/2020    Final                   Value:NO GROWTH Performed at [facility], [address] [place], [place] [address]   . Report Status 07/06/2020 07/07/2020 FINAL   Final  . Neisseria Gonorrhea 07/06/2020 Positive*  Final  . Chlamydia 07/06/2020 Negative   Final  . Comment 07/06/2020 Normal Reference Ranger Chlamydia - Negative   Final  . Comment 07/06/2020 Normal Reference Range Neisseria Gonorrhea - Negative   Final  . RPR Ser Ql 07/06/2020 NON REACTIVE  NON REACTIVE Final   Performed at [facility], [address] [place], [place] [address]  . HIV Screen 4th Generation  wRfx 07/06/2020 Non Reactive  Non Reactive Final   Performed at [facility], [address] [place], [place] [address]  . WBC 07/06/2020 5.6  4.0 - 10.5 K/uL Final  . RBC 07/06/2020  3.88* 4.22 - 5.81 MIL/uL Final  . Hemoglobin 07/06/2020 12.9* 13.0 - 17.0 g/dL Final  . HCT 60/45/4098 38.5* 39 - 52 % Final  . MCV 07/06/2020 99.2  80.0 - 100.0 fL Final  . MCH 07/06/2020 33.2  26.0 - 34.0 pg Final  . MCHC 07/06/2020 33.5  30.0 - 36.0 g/dL Final  . RDW 11/91/4782 14.1  11.5 - 15.5 % Final  . Platelets 07/06/2020 269  150 - 400 K/uL Final  . nRBC 07/06/2020 0.0  0.0 - 0.2 % Final  . Neutrophils Relative % 07/06/2020 60  % Final  . Neutro Abs 07/06/2020 3.4  1.7 - 7.7 K/uL Final  . Lymphocytes Relative 07/06/2020 29  % Final  . Lymphs Abs 07/06/2020 1.7  0.7 - 4.0 K/uL Final  . Monocytes Relative 07/06/2020 9  % Final  . Monocytes Absolute 07/06/2020 0.5  0.1 - 1.0 K/uL Final  . Eosinophils Relative 07/06/2020 1  % Final  . Eosinophils Absolute 07/06/2020 0.1  0.0 - 0.5 K/uL Final  . Basophils Relative 07/06/2020 1  % Final  . Basophils Absolute 07/06/2020 0.0  0.0 - 0.1 K/uL Final  . Immature Granulocytes 07/06/2020 0  % Final  . Abs Immature Granulocytes 07/06/2020 0.01  0.00 - 0.07 K/uL Final   Performed at [facility], [address] [place], [place] [address]    Allergies: Patient has no known allergies.  PTA Medications: (Not in a hospital admission)   Medical Decision Making  Discussed initiating Zyprexa as well as remain overnight in observation area, discussed risk versus benefits as well as side effects.  Patient verbalizes understanding and agreement with plan.  Medications: -Zyprexa 2.5 mg nightly -Trazodone 50 mg nightly as needed/sleep -Vistaril 25 mg 3 times daily as needed     Recommendations  Based on my evaluation the patient does not appear to have an emergency medical condition.   Patient will be placed in the continuous assessment area at [facility] for treatment and stabilization.  Patient will be reassessed on 08/18/2020, disposition will be determined by the treatment team at that time.  Patrcia Dolly, FNP 08/17/20  7:07 PM

## 2020-08-17 NOTE — BH Assessment (Signed)
Comprehensive Clinical Assessment (CCA) Note  08/17/2020 Cassell ClementDaniel Grygiel [id number]  Chief Complaint:  Chief Complaint  Patient presents with  . Addiction Problem    with psychosis   Visit Diagnosis: Substance-induced Psychosis  NARRATIVE:  Pt is a 39 year old male who [facility] on a voluntary basis in apparent state of intoxication and with apparent delusion.  [place] the [place] area, and per report, he lives in various hotels.  Pt was last assessed by TTS on 07/14/2020, and he was kept for overnight observation.  Pt reported that he is employed in several positions.  Pt reported that he was commanded by his creator to come [facility] to talk about his spiritual journey and spiritual battle.  Pt reported that he is fighting against ''demons... the darkness... fallen angels.''  When asked what he was experiencing, Pt stated that he was involved in a battle.  ''Negative energy all around me... I'm on a spiritual journey... I am fasting (no food or sleep) to be in the spiritual realm.''  Pt stated that he has used an unknown quantity of cocaine today, as well as a gram of marijuana.  Pt has a history of alcohol use, and when asked about alcohol use, he was vague and unclear.  When asked about hallucinations, Pt asked, ''Do you know about fallen angels?  That's it.''  Pt said he lives alone, that he is not homeless, and that he has six children including a newborn.  Pt declined to give the contact information for a partner, stating, ''I am alone.''  Pt spoke very rapidly, garbled his words, buried his head in his hands, and seemed to experience tangential thinking.  When asked what help we could provide, Pt responded, ''I don't know.  That's your job.''  During assessment, Pt presented as being in an altered mental state.  He had fleeting eye contact.  Demeanor was restless.  Pt's mood and affect were preoccupied and expansive.  Pt's speech was rapid, garbled, and tangential.  Thought  processes were tangential.  Thought content suggested delusion.  Pt's memory and concentration could not be adequately assessed.  Pt's impulse control, judgment, and insight were impaired.   CCA Screening, Triage and Referral (STR)  Patient Reported Information How did you hear about us? Self  Referral name: Self  Referral phone number: No data recorded  Whom do you see for routine medical problems? I don't have a doctor  Practice/Facility Name: No data recorded Practice/Facility Phone Number: No data recorded Name of Contact: No data recorded Contact Number: No data recorded Contact Fax Number: No data recorded Prescriber Name: No data recorded Prescriber Address (if known): No data recorded  What Is the Reason for Your Visit/Call Today? ''I am in spiriual warfare''; endorsed use of cocaine and marijuana  How Long Has This Been Causing You Problems? > than 6 months  What Do You Feel Would Help You the Most Today? Other (Comment) (''I don't know'')   Have You Recently Been in Any Inpatient Treatment (Hospital/Detox/Crisis Center/28-Day Program)? Yes (Phreesia 08/17/2020)  Name/Location of Program/Hospital:[facility]  How Long Were You There? 2 days  When Were You Discharged? No data recorded  Have You Ever Received Services From [facility] Before? Yes  Who [facility] You [facility]? [facility], [facility]   Have You Recently Had Any Thoughts About Hurting Yourself? No  Are You Planning to Commit Suicide/Harm Yourself At This time? No   Have you Recently Had Thoughts About Hurting Someone  Else? No  Explanation: No data recorded  Have You Used Any Alcohol or Drugs in the Past 24 Hours? Yes  How Long Ago Did You Use Drugs or Alcohol? No data recorded What Did You Use and How Much? Cocaine; 1 gram of marijuana   Do You Currently Have a Therapist/Psychiatrist? No  Name of Therapist/Psychiatrist: No data recorded  Have You Been Recently Discharged From Any Office Practice or  Programs? No  Explanation of Discharge From Practice/Program: No data recorded    CCA Screening Triage Referral Assessment Type of Contact: Face-to-Face  Is this Initial or Reassessment? No data recorded Date Telepsych consult ordered in [facility]:  No data recorded Time Telepsych consult ordered in [facility]:  No data recorded  Patient Reported Information Reviewed? Yes  Patient Left Without Being Seen? No data recorded Reason for Not Completing Assessment: No data recorded  Collateral Involvement: Pt declined to give collateral contact info ''I'm on my own''   Does Patient Have a Court Appointed Legal Guardian? No data recorded Name and Contact of Legal Guardian: No data recorded If Minor and Not Living with Parent(s), Who has Custody? N/A  Is CPS involved or ever been involved? Never  Is APS involved or ever been involved? Never   Patient Determined To Be At Risk for Harm To Self or Others Based on Review of Patient Reported Information or Presenting Complaint? No  Method: No data recorded Availability of Means: No data recorded Intent: No data recorded Notification Required: No data recorded Additional Information for Danger to Others Potential: No data recorded Additional Comments for Danger to Others Potential: No data recorded Are There Guns or Other Weapons in Your Home? No data recorded Types of Guns/Weapons: No data recorded Are These Weapons Safely Secured?                            No data recorded Who Could Verify You Are Able To Have These Secured: No data recorded Do You Have any Outstanding Charges, Pending Court Dates, Parole/Probation? No data recorded Contacted To Inform of Risk of Harm To Self or Others: No data recorded  Location of Assessment: [facility]   Does Patient Present under Involuntary Commitment? No  IVC Papers Initial File Date: No data recorded  [place] of Residence: [place]   Patient Currently Receiving the Following  Services: Not Receiving Services   Determination of Need: Emergent (2 hours)   Options For Referral: [facility]     CCA Biopsychosocial Intake/Chief Complaint:  ''I am in spirital warfare... I am fighting demons and fallen angels''  Current Symptoms/Problems: Pt appeared intoxicated; endorsed hallucination; seemed to expeirence religious/grandiose behavior   Patient Reported Schizophrenia/Schizoaffective Diagnosis in Past: No   Strengths: UTA  Preferences: Pt expressed a desire for therapy and medication management.  Abilities: UTA   Type of Services Patient Feels are Needed: Pt would like to receive therapy and medication management services.   Initial Clinical Notes/Concerns: Overnight Obs at [facility]   Mental Health Symptoms Depression:  None   Duration of Depressive symptoms: No data recorded  Mania:  None   Anxiety:   None   Psychosis:  Delusions;Hallucinations   Duration of Psychotic symptoms: Greater than six months   Trauma:  N/A   Obsessions:  N/A   Compulsions:  None   Inattention:  None   Hyperactivity/Impulsivity:  N/A   Oppositional/Defiant Behaviors:  No data recorded  Emotional Irregularity:  Potentially  harmful impulsivity   Other Mood/Personality Symptoms:  UTA    Mental Status Exam Appearance and self-care  Stature:  Average   Weight:  Average weight   Clothing:  Disheveled   Grooming:  Normal   Cosmetic use:  None   Posture/gait:  Stooped   Motor activity:  Not Remarkable   Sensorium  Attention:  Unaware   Concentration:  Normal   Orientation:  X5   Recall/memory:  Normal   Affect and Mood  Affect:  Inappropriate   Mood:  Dysphoric;Anxious   Relating  Eye contact:  Fleeting   Facial expression:  Responsive   Attitude toward examiner:  Dramatic   Thought and Language  Speech flow: Flight of Ideas;Garbled   Thought content:  Delusions   Preoccupation:  Religion   Hallucinations:  Auditory    Organization:  No data recorded  [employer] of Knowledge:  Impoverished by (Comment) (Substance use)   Intelligence:  Needs investigation   Abstraction:  Concrete   Judgement:  Impaired   Reality Testing:  Distorted   Insight:  Poor   Decision Making:  Impulsive   Social Functioning  Social Maturity:  Impulsive   Social Judgement:  Impropriety   Stress  Stressors:  Other (Comment) (substance use)   Coping Ability:  Deficient supports   Skill Deficits:  Self-control;Self-care   Supports:  Support needed     Religion:    Leisure/Recreation:    Exercise/Diet:     CCA Employment/Education Employment/Work Situation: Employment / Work Situation Employment situation: Employed Patient's job has been impacted by current illness: Yes Describe how patient's job has been impacted: Pt stated that he will not be able to work because of his ''spiritual journey'' Has patient ever been in the [employer]?: No  Education: Education Is Patient Currently Attending School?: No   CCA Family/Childhood History Family and Relationship History: Family history Marital status: Other (comment) (Unknown) Are you sexually active?: Yes Does patient have children?: Yes How many children?: 6 How is patient's relationship with their children?: Unknown  Childhood History:     Child/Adolescent Assessment:     CCA Substance Use Alcohol/Drug Use: Alcohol / Drug Use Pain Medications: Please see MAR Prescriptions: Please see MAR Over the Counter: Please see MAR History of alcohol / drug use?: Yes Substance #1 Name of Substance 1: Marijuana 1 - Amount (size/oz): Varied 1 - Frequency: Daily 1 - Duration: Ongoing 1 - Last Use / Amount: 1 gram today Substance #2 Name of Substance 2: Cocaine 2 - Last Use / Amount: Varied; 08/17/2020                     ASAM's:  Six Dimensions of Multidimensional Assessment  Dimension 1:  Acute Intoxication and/or  Withdrawal Potential:      Dimension 2:  Biomedical Conditions and Complications:      Dimension 3:  Emotional, Behavioral, or Cognitive Conditions and Complications:     Dimension 4:  Readiness to Change:     Dimension 5:  Relapse, Continued use, or Continued Problem Potential:     Dimension 6:  Recovery/Living Environment:     ASAM Severity Score:    ASAM Recommended Level of Treatment:     Substance use Disorder (SUD)    Recommendations for Services/Supports/Treatments:    DSM5 Diagnoses: Patient Active Problem List   Diagnosis Date Noted  . Substance-induced psychotic disorder with delusions (HCC)   . Alcohol abuse with alcohol-induced mood disorder (HCC) 08/27/2018  Patient Centered Plan: Patient is on the following Treatment Plan(s):    Referrals to Alternative Service(s): Referred to Alternative Service(s):   Place:   Date:   Time:    Referred to Alternative Service(s):   Place:   Date:   Time:    Referred to Alternative Service(s):   Place:   Date:   Time:    Referred to Alternative Service(s):   Place:   Date:   Time:     DISPOSITION:  Consulted with T. Arlana Pouch, NP, who also assessed Pt.  Pt is to be admitted for overnight obs to [facility]. Earline May[place], [facility]

## 2020-08-17 NOTE — ED Triage Notes (Addendum)
Patient states he is using cocaine and smoking marijuana and he can't continue this way. Patient is wanting help for his addictions. Patient states he is dealing with emotions and dreams and I work for God and devils are after me. Patient denies SI/HI.

## 2020-08-17 NOTE — ED Notes (Signed)
Pt A&O x 4, no distress noted, resting at present, monitoring for safety.

## 2020-08-18 DIAGNOSIS — F1994 Other psychoactive substance use, unspecified with psychoactive substance-induced mood disorder: Secondary | ICD-10-CM | POA: Diagnosis present

## 2020-08-18 DIAGNOSIS — F1910 Other psychoactive substance abuse, uncomplicated: Secondary | ICD-10-CM | POA: Diagnosis present

## 2020-08-18 DIAGNOSIS — Z5900 Homelessness unspecified: Secondary | ICD-10-CM

## 2020-08-18 MED ORDER — HYDROXYZINE HCL 25 MG PO TABS: 25.0000 mg | ORAL_TABLET | Freq: Three times a day (TID) | ORAL | 0 refills | Status: AC | PRN

## 2020-08-18 MED ORDER — OLANZAPINE 2.5 MG PO TABS: 2.5000 mg | ORAL_TABLET | Freq: Every day | ORAL | 0 refills | Status: AC

## 2020-08-18 MED ORDER — TRAZODONE HCL 50 MG PO TABS: 50.0000 mg | ORAL_TABLET | Freq: Every evening | ORAL | 0 refills | Status: AC | PRN

## 2020-08-18 NOTE — ED Notes (Signed)
Discharge instructions provided to Pt. He stated understanding. Requested samples. Sent message to NP for Pt's request.

## 2020-08-18 NOTE — ED Notes (Signed)
Pt sleeping at present, no distress noted, monitoring for safety. 
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
## 2020-08-18 NOTE — ED Notes (Signed)
Lunch given- tuna, chips, apple juice

## 2020-08-18 NOTE — ED Notes (Signed)
Pt currently sleeping in bed. Safety maintained and will continue to monitor.  

## 2020-08-18 NOTE — ED Notes (Signed)
Prescriptions and samples provided for Pt prior to d/c. Pt stated understanding of needing to have prescriptions filled. D/c instructions previously discussed and Pt stated understanding at that time. Pt received his persoanl belongings and escorted to the front lobby. Safety maintained.

## 2020-08-19 LAB — PROLACTIN: Prolactin: 12.2 ng/mL (ref 4.0–15.2)

## 2020-08-30 IMAGING — DX DG MANDIBLE 1-3V
3 series · 3 of 3 positions shown · non-contrast
Comparison: None.

CLINICAL DATA: Mandibular fracture with surgery 8 weeks ago.
Persistent left jaw pain.

EXAM:
MANDIBLE - 1-3 VIEW

[w mandible pa]
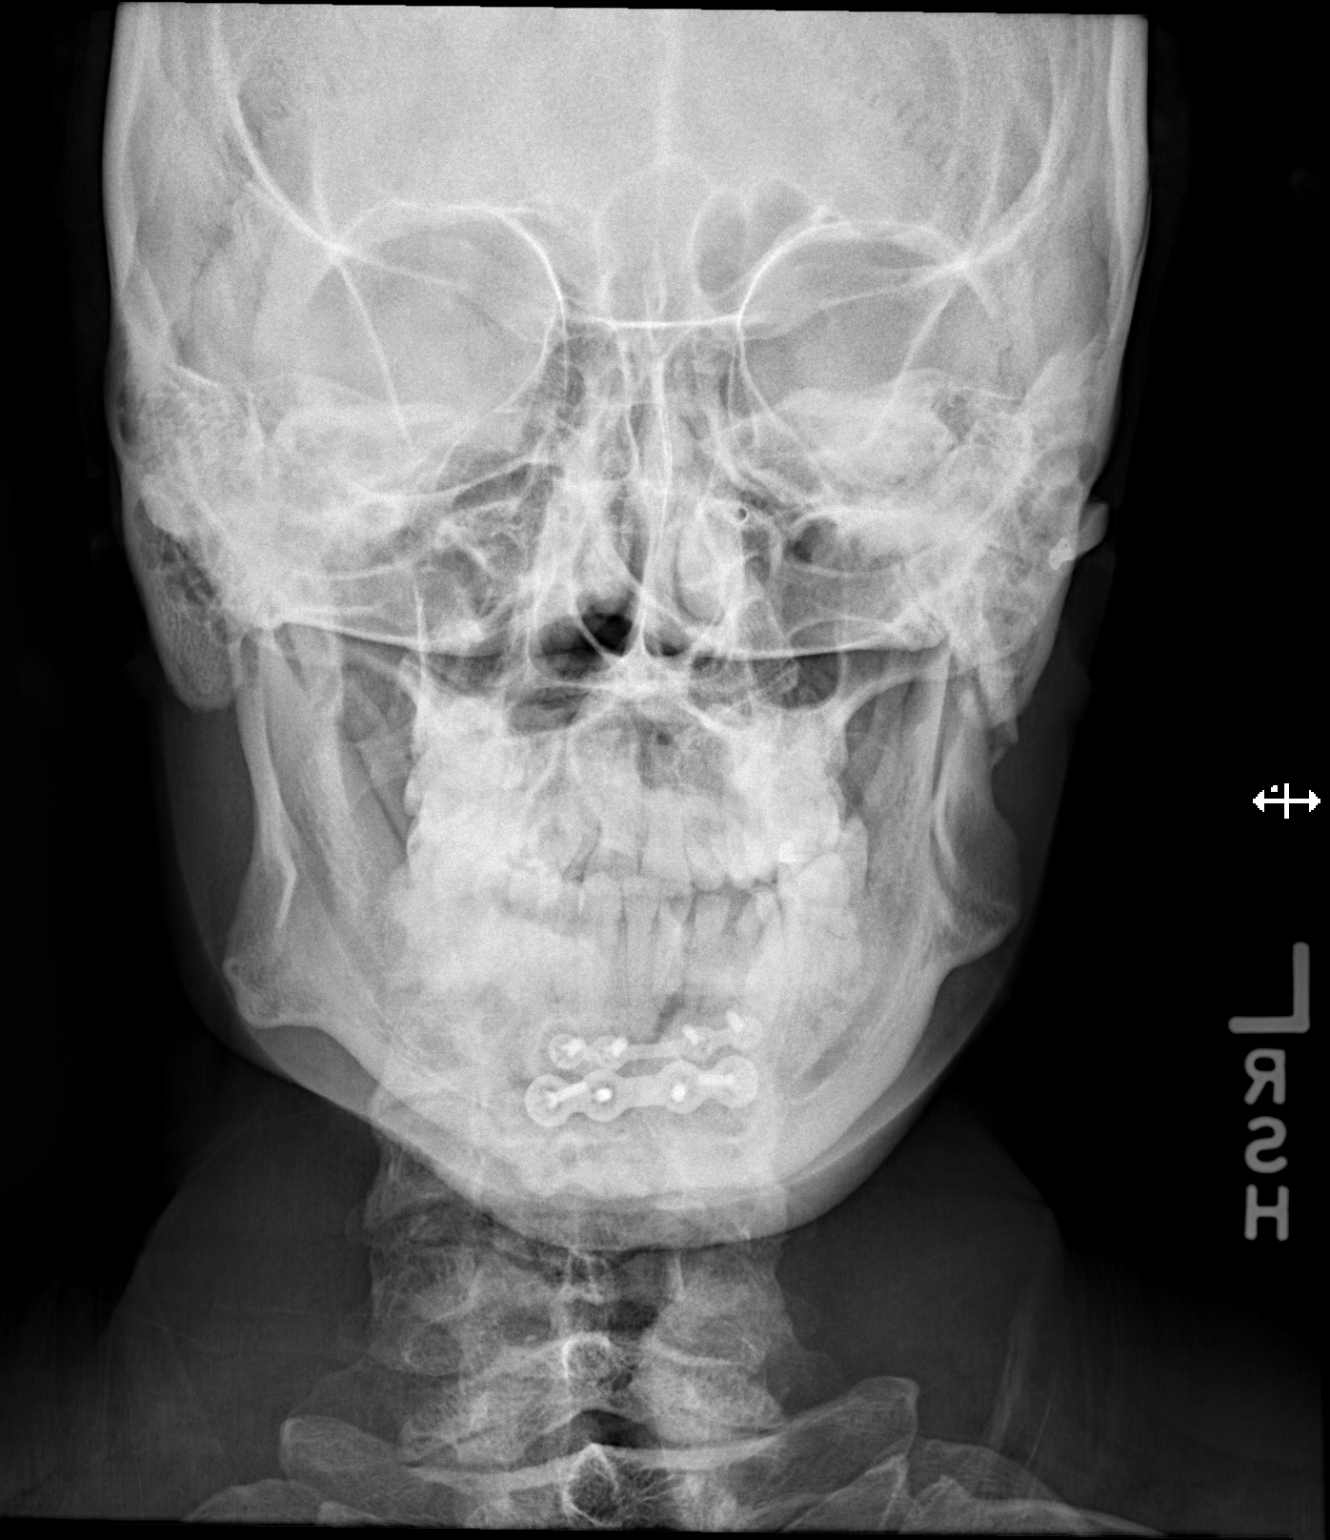

[[person_name] (1 of 2)]
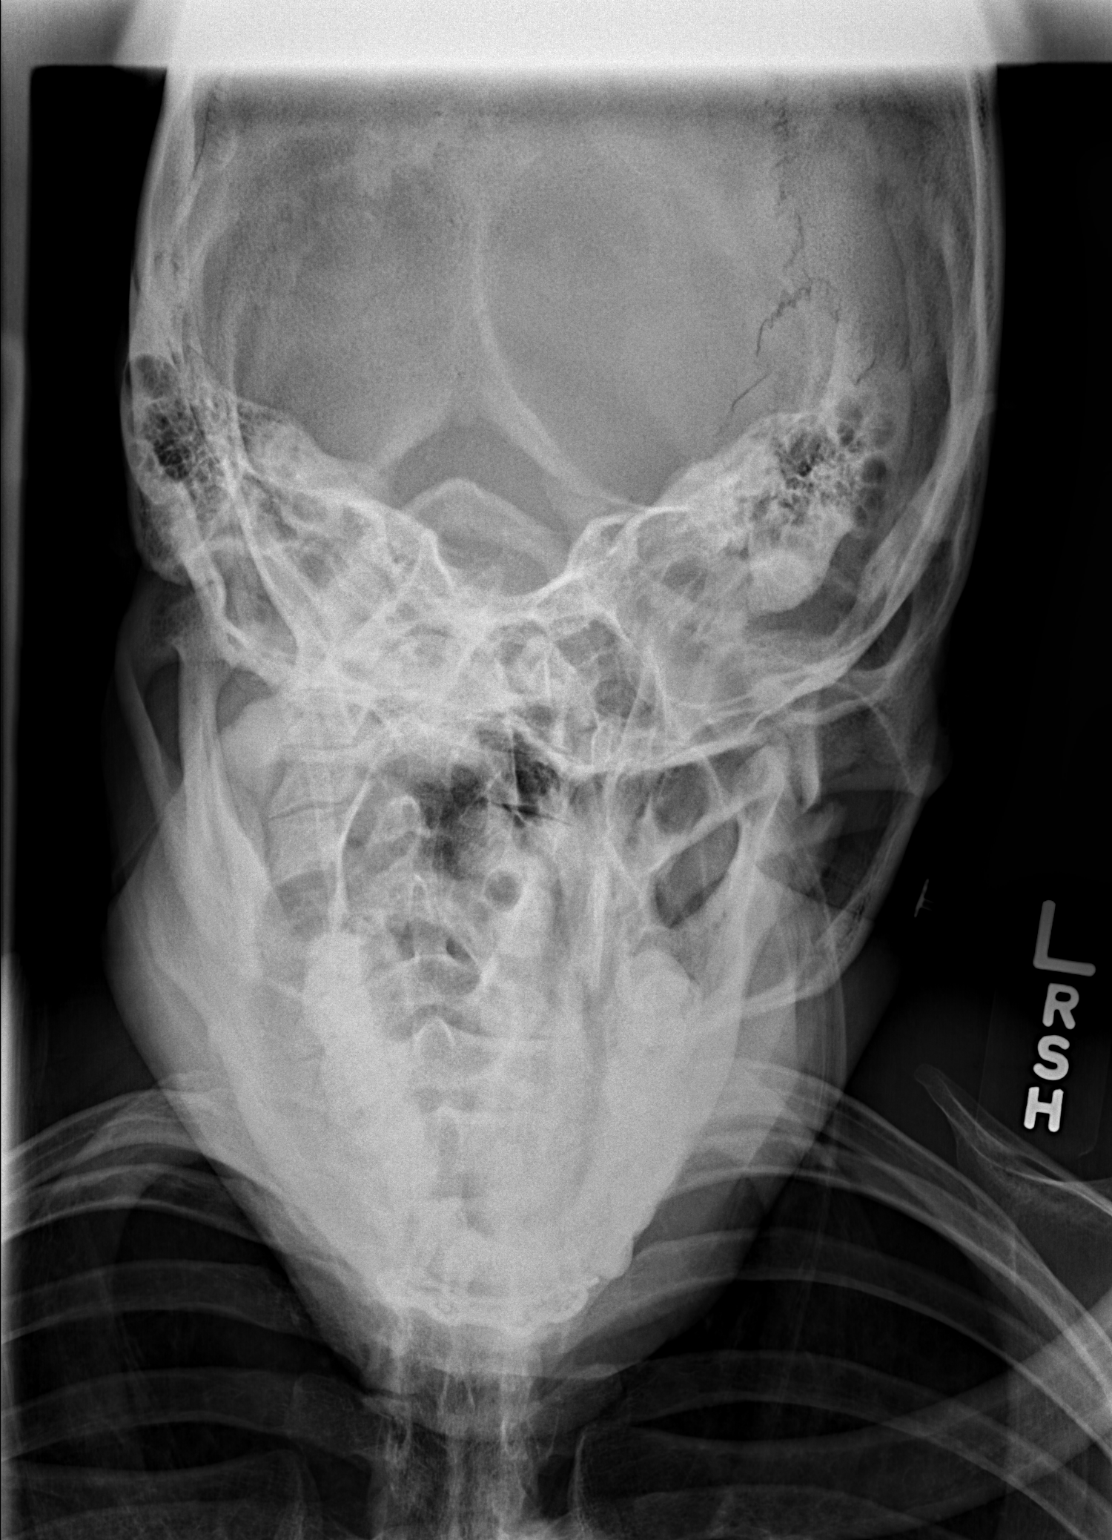

[[person_name] (2 of 2)]
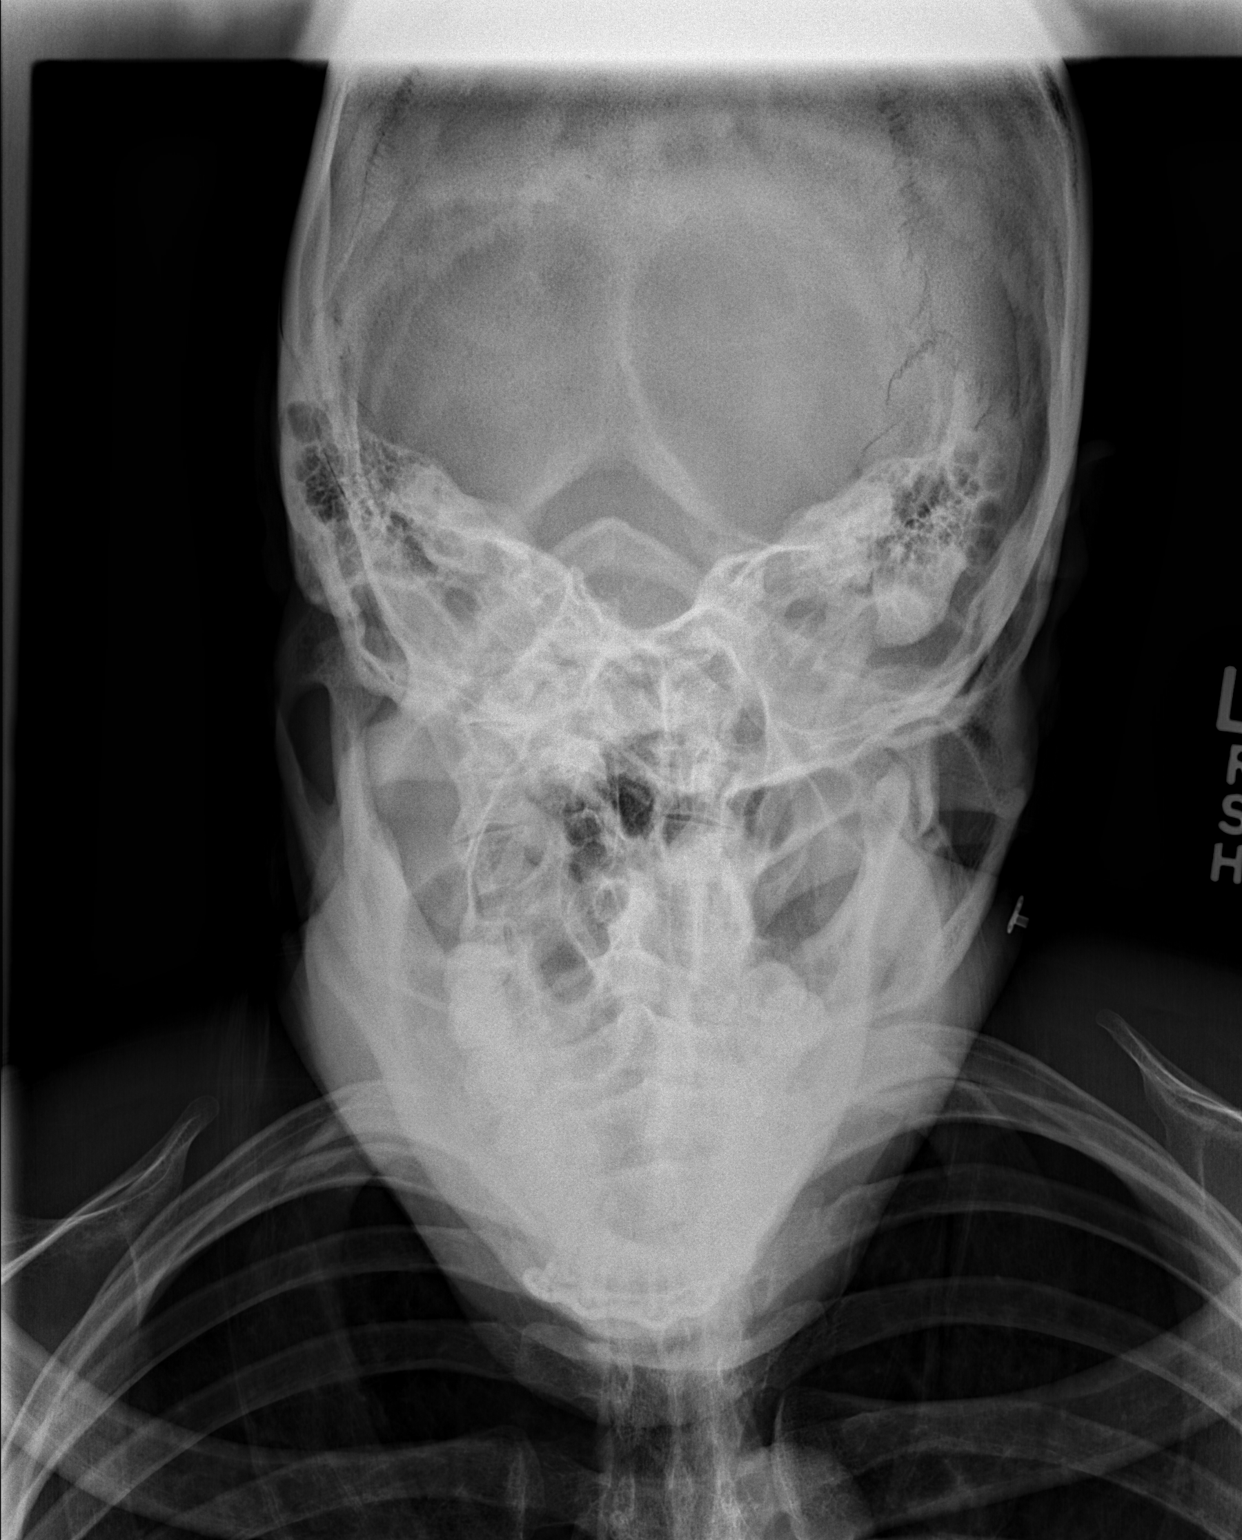

[3 of 3 positions shown; findings below may reference images not displayed]

FINDINGS: There has been fixation of a mandibular symphysis fracture with 2
plates and screws. There is a mildly displaced and angulated
fracture of base of the mandibular condyle on the left that does not
appear to have undergone fixation. Recommend CT scan for more
accurate evaluation. There is some sort of radiodense structure
associated with the left cheek, questionably jewelry.
IMPRESSION: Plate fixation of a mandibular symphysis fracture. Apparently un
treated fracture of the base of the mandibular condyle on the left
with slight angulation and displacement. Consider CT scan.

## 2020-09-01 ENCOUNTER — Telehealth (HOSPITAL_COMMUNITY): Payer: Self-pay | Admitting: General Practice

## 2020-09-01 NOTE — Telephone Encounter (Signed)
Care Management - Follow Up [facility] Discharges   Writer attempted to make contact with patient today and was unsuccessful.  Writer was able to leave a HIPPA compliant voice message and will await callback.   

## 2020-09-04 ENCOUNTER — Emergency Department (HOSPITAL_COMMUNITY)
Admission: EM | Admit: 2020-09-04 | Discharge: 2020-09-05 | Disposition: A | Discharge status: Home / Self Care | Payer: Self-pay | Attending: Emergency Medicine | Admitting: Emergency Medicine

## 2020-09-04 ENCOUNTER — Encounter (HOSPITAL_COMMUNITY): Payer: Self-pay | Admitting: Emergency Medicine

## 2020-09-04 DIAGNOSIS — R5381 Other malaise: Secondary | ICD-10-CM | POA: Insufficient documentation

## 2020-09-04 DIAGNOSIS — N390 Urinary tract infection, site not specified: Secondary | ICD-10-CM

## 2020-09-04 DIAGNOSIS — Z20822 Contact with and (suspected) exposure to covid-19: Secondary | ICD-10-CM | POA: Insufficient documentation

## 2020-09-04 DIAGNOSIS — R52 Pain, unspecified: Secondary | ICD-10-CM | POA: Insufficient documentation

## 2020-09-04 DIAGNOSIS — F17210 Nicotine dependence, cigarettes, uncomplicated: Secondary | ICD-10-CM | POA: Insufficient documentation

## 2020-09-04 DIAGNOSIS — R112 Nausea with vomiting, unspecified: Secondary | ICD-10-CM

## 2020-09-04 LAB — BASIC METABOLIC PANEL
Anion gap: 10 (ref 5–15)
BUN: 26 mg/dL — ABNORMAL HIGH (ref 6–20)
CO2: 23 mmol/L (ref 22–32)
Calcium: 9 mg/dL (ref 8.9–10.3)
Chloride: 105 mmol/L (ref 98–111)
Creatinine, Ser: 1.15 mg/dL (ref 0.61–1.24)
GFR, Estimated: >60 mL/min (ref >60)
Glucose, Bld: 136 mg/dL — ABNORMAL HIGH (ref 70–99)
Potassium: 4.9 mmol/L (ref 3.5–5.1)
Sodium: 138 mmol/L (ref 135–145)

## 2020-09-04 LAB — CBC
HCT: 38.3 % — ABNORMAL LOW (ref 39.0–52.0)
Hemoglobin: 12.6 g/dL — ABNORMAL LOW (ref 13.0–17.0)
MCH: 33.2 pg (ref 26.0–34.0)
MCHC: 32.9 g/dL (ref 30.0–36.0)
MCV: 101.1 fL — ABNORMAL HIGH (ref 80.0–100.0)
Platelets: 269 10*3/uL (ref 150–400)
RBC: 3.79 MIL/uL — ABNORMAL LOW (ref 4.22–5.81)
RDW: 13.6 % (ref 11.5–15.5)
WBC: 5.5 10*3/uL (ref 4.0–10.5)
nRBC: 0 % (ref 0.0–0.2)

## 2020-09-04 LAB — CBG MONITORING, ED: Glucose-Capillary: 131 mg/dL — ABNORMAL HIGH (ref 70–99)

## 2020-09-04 LAB — LIPASE, BLOOD: Lipase: 40 U/L (ref 11–51)

## 2020-09-04 NOTE — ED Triage Notes (Signed)
[place], vomiting, generalized body aches that started today. "I just feel bad".

## 2020-09-05 ENCOUNTER — Encounter (HOSPITAL_COMMUNITY): Payer: Self-pay | Admitting: Emergency Medicine

## 2020-09-05 LAB — URINALYSIS, ROUTINE W REFLEX MICROSCOPIC
Bilirubin Urine: NEGATIVE
Glucose, UA: NEGATIVE mg/dL
Hgb urine dipstick: NEGATIVE
Ketones, ur: NEGATIVE mg/dL
Nitrite: NEGATIVE
Protein, ur: NEGATIVE mg/dL
Specific Gravity, Urine: 1.027 (ref 1.005–1.030)
WBC, UA: >50 WBC/hpf — ABNORMAL HIGH (ref 0–5)
pH: 6 (ref 5.0–8.0)

## 2020-09-05 LAB — RESP PANEL BY RT-PCR (FLU A&B, COVID) ARPGX2
Influenza A by PCR: NEGATIVE
Influenza B by PCR: NEGATIVE
SARS Coronavirus 2 by RT PCR: NEGATIVE

## 2020-09-05 LAB — HIV ANTIBODY (ROUTINE TESTING W REFLEX): HIV Screen 4th Generation wRfx: NONREACTIVE

## 2020-09-05 MED ORDER — SODIUM CHLORIDE 0.9 % IV SOLN
1.0000 g | Freq: Once | INTRAVENOUS | Status: AC
  Administered 2020-09-05: 02:00:00 1 g via INTRAVENOUS
  Filled 2020-09-05: qty 10

## 2020-09-05 MED ORDER — CEPHALEXIN 500 MG PO CAPS: 500.0000 mg | ORAL_CAPSULE | Freq: Two times a day (BID) | ORAL | 0 refills | Status: AC

## 2020-09-05 MED ORDER — SODIUM CHLORIDE 0.9 % IV BOLUS
1000.0000 mL | Freq: Once | INTRAVENOUS | Status: AC
  Administered 2020-09-05: 1000 mL via INTRAVENOUS

## 2020-09-05 MED ORDER — ONDANSETRON 8 MG PO TBDP: 8.0000 mg | ORAL_TABLET | Freq: Three times a day (TID) | ORAL | 0 refills | Status: AC | PRN

## 2020-09-05 MED ORDER — KETOROLAC TROMETHAMINE 15 MG/ML IJ SOLN
15.0000 mg | Freq: Once | INTRAMUSCULAR | Status: AC
  Administered 2020-09-05: 15 mg via INTRAVENOUS
  Filled 2020-09-05: qty 1

## 2020-09-05 MED ORDER — AZITHROMYCIN 1 G PO PACK
1.0000 g | PACK | Freq: Once | ORAL | Status: AC
  Administered 2020-09-05: 02:00:00 1 g via ORAL
  Filled 2020-09-05: qty 1

## 2020-09-05 MED ORDER — ONDANSETRON HCL 4 MG/2ML IJ SOLN
4.0000 mg | Freq: Once | INTRAMUSCULAR | Status: AC
  Administered 2020-09-05: 4 mg via INTRAVENOUS
  Filled 2020-09-05: qty 2

## 2020-09-05 NOTE — ED Notes (Signed)
Called lab to add on urine culture and gc/chlamydia.

## 2020-09-05 NOTE — ED Provider Notes (Signed)
[facility]-EMERGENCY DEPT Provider Note: Lowella Dell, MD, FACEP  CSN: [id number] MRN: [id number] ARRIVAL: 09/04/20 at 2002 ROOM: WA07/WA07   CHIEF COMPLAINT  Generalized Body Aches   HISTORY OF PRESENT ILLNESS  09/05/20 12:01 AM Christian Williams is a 39 y.o. male with nausea, vomiting, generalized body aches and malaise that began yesterday.  He describes his body aches as aching in nature and rates them at 10 out of 10.  He has also had a sensation of feeling hot and cold although he has not had a documented fever.  Earlier he was unable to drink fluids because they would induce vomiting.  He states his mouth is dry.   Past Medical History:  Diagnosis Date   Polysubstance abuse [facility])     Past Surgical History:  Procedure Laterality Date   MANDIBLE FRACTURE SURGERY      No family history on file.  Social History   Tobacco Use   Smoking status: Current Some Day Smoker    Packs/day: 0.25    Types: Cigarettes   Smokeless tobacco: Never Used  Vaping Use   Vaping Use: Never used  Substance Use Topics   Alcohol use: Not Currently   Drug use: Yes    Types: Marijuana    Prior to Admission medications   Medication Sig Start Date End Date Taking? Authorizing Provider  cephALEXin (KEFLEX) 500 MG capsule Take 1 capsule (500 mg total) by mouth 2 (two) times daily. 09/05/20   Annison Birchard, MD  hydrOXYzine (ATARAX/VISTARIL) 25 MG tablet Take 1 tablet (25 mg total) by mouth 3 (three) times daily as needed for anxiety. 08/18/20   Rankin, Shuvon B, NP  OLANZapine (ZYPREXA) 2.5 MG tablet Take 1 tablet (2.5 mg total) by mouth at bedtime. 08/18/20   Rankin, Shuvon B, NP  ondansetron (ZOFRAN ODT) 8 MG disintegrating tablet Take 1 tablet (8 mg total) by mouth every 8 (eight) hours as needed for nausea or vomiting. 09/05/20   Jaimie Pippins, MD  traZODone (DESYREL) 50 MG tablet Take 1 tablet (50 mg total) by mouth at bedtime as needed for sleep. 08/18/20   Rankin, Shuvon B, NP     Allergies Patient has no known allergies.   REVIEW OF SYSTEMS  Negative except as noted here or in the History of Present Illness.   PHYSICAL EXAMINATION  Initial Vital Signs Blood pressure 118/72, pulse 74, temperature 98 F (36.7 C), temperature source Oral, resp. rate 16, height 5\' 9"  (1.753 m), weight 68 kg, SpO2 98 %.  Examination General: Well-developed, well-nourished male in no acute distress; appearance consistent with age of record HENT: normocephalic; atraumatic Eyes: pupils equal, round and reactive to light; extraocular muscles intact Neck: supple Heart: regular rate and rhythm Lungs: clear to auscultation bilaterally Abdomen: soft; nondistended; nontender; no masses or hepatosplenomegaly; bowel sounds present Extremities: No deformity; full range of motion; pulses normal Neurologic: Awake, alert and oriented; motor function intact in all extremities and symmetric; no facial droop Skin: Warm and dry Psychiatric: Flat affect   RESULTS  Summary of this visit's results, reviewed and interpreted by myself:   EKG Interpretation  Date/Time:    Ventricular Rate:    PR Interval:    QRS Duration:   QT Interval:    QTC Calculation:   R Axis:     Text Interpretation:        Laboratory Studies: Results for orders placed or performed during the hospital encounter of 09/04/20 (from the past 24 hour(s))  Basic metabolic panel  Status: Abnormal   Collection Time: 09/04/20  8:10 PM  Result Value Ref Range   Sodium 138 135 - 145 mmol/L   Potassium 4.9 3.5 - 5.1 mmol/L   Chloride 105 98 - 111 mmol/L   CO2 23 22 - 32 mmol/L   Glucose, Bld 136 (H) 70 - 99 mg/dL   BUN 26 (H) 6 - 20 mg/dL   Creatinine, Ser 1.69 0.61 - 1.24 mg/dL   Calcium 9.0 8.9 - 67.8 mg/dL   GFR, Estimated >93 >81 mL/min   Anion gap 10 5 - 15  CBC     Status: Abnormal   Collection Time: 09/04/20  8:10 PM  Result Value Ref Range   WBC 5.5 4.0 - 10.5 K/uL   RBC 3.79 (L) 4.22 - 5.81 MIL/uL    Hemoglobin 12.6 (L) 13.0 - 17.0 g/dL   HCT 01.7 (L) 51.0 - 25.8 %   MCV 101.1 (H) 80.0 - 100.0 fL   MCH 33.2 26.0 - 34.0 pg   MCHC 32.9 30.0 - 36.0 g/dL   RDW 52.7 78.2 - 42.3 %   Platelets 269 150 - 400 K/uL   nRBC 0.0 0.0 - 0.2 %  Lipase, blood     Status: None   Collection Time: 09/04/20  8:10 PM  Result Value Ref Range   Lipase 40 11 - 51 U/L  Urinalysis, Routine w reflex microscopic Nasopharyngeal Swab     Status: Abnormal   Collection Time: 09/04/20  8:39 PM  Result Value Ref Range   Color, Urine YELLOW YELLOW   APPearance HAZY (A) CLEAR   Specific Gravity, Urine 1.027 1.005 - 1.030   pH 6.0 5.0 - 8.0   Glucose, UA NEGATIVE NEGATIVE mg/dL   Hgb urine dipstick NEGATIVE NEGATIVE   Bilirubin Urine NEGATIVE NEGATIVE   Ketones, ur NEGATIVE NEGATIVE mg/dL   Protein, ur NEGATIVE NEGATIVE mg/dL   Nitrite NEGATIVE NEGATIVE   Leukocytes,Ua SMALL (A) NEGATIVE   RBC / HPF 0-5 0 - 5 RBC/hpf   WBC, UA >50 (H) 0 - 5 WBC/hpf   Bacteria, UA RARE (A) NONE SEEN   WBC Clumps PRESENT    Mucus PRESENT   CBG monitoring, ED     Status: Abnormal   Collection Time: 09/04/20  8:45 PM  Result Value Ref Range   Glucose-Capillary 131 (H) 70 - 99 mg/dL  Resp Panel by RT-PCR (Flu A&B, Covid) Nasopharyngeal Swab     Status: None   Collection Time: 09/05/20 12:16 AM   Specimen: Nasopharyngeal Swab; Nasopharyngeal(NP) swabs in vial transport medium  Result Value Ref Range   SARS Coronavirus 2 by RT PCR NEGATIVE NEGATIVE   Influenza A by PCR NEGATIVE NEGATIVE   Influenza B by PCR NEGATIVE NEGATIVE   Imaging Studies: No results found.  ED COURSE and MDM  Nursing notes, initial and subsequent vitals signs, including pulse oximetry, reviewed and interpreted by myself.  Vitals:   09/04/20 2035 09/05/20 0015  BP: 118/72 103/64  Pulse: 74 61  Resp: 16 16  Temp: 98 F (36.7 C)   TempSrc: Oral   SpO2: 98% 97%  Weight: 68 kg   Height: 5\' 9"  (1.753 m)    Medications  cefTRIAXone (ROCEPHIN) 1  g in sodium chloride 0.9 % 100 mL IVPB (has no administration in time range)  azithromycin (ZITHROMAX) powder 1 g (has no administration in time range)  sodium chloride 0.9 % bolus 1,000 mL (1,000 mLs Intravenous New Bag/Given 09/05/20 0026)  ondansetron (ZOFRAN) injection 4  mg (4 mg Intravenous Given 09/05/20 0025)  ketorolac (TORADOL) 15 MG/ML injection 15 mg (15 mg Intravenous Given 09/05/20 0022)   1:59 AM Patient able to drink fluids after IV fluid bolus and Zofran.  He does have significant pyuria on urinalysis.  Urine has been sent for culture as well as gonorrhea and Chlamydia testing.  We will go ahead and treat him with Rocephin and Zithromax as he is at high risk for STDs being a homeless person with a history of drug abuse.  An ordinary urinary tract infection (cystitis) is less likely in this otherwise young male.    PROCEDURES  Procedures   ED DIAGNOSES     ICD-10-CM   1. Lower urinary tract infectious disease  N39.0   2. Nausea and vomiting in adult  R11.2        Paula Libra, MD 09/05/20 [phone]

## 2020-09-06 LAB — URINE CULTURE: Culture: NO GROWTH

## 2021-01-19 DIAGNOSIS — F32A Depression, unspecified: Secondary | ICD-10-CM

## 2021-01-19 NOTE — ED Notes (Signed)
 I have reviewed discharge instructions with the patient.  The patient verbalized understanding.    Patient left ED via Discharge Method: ambulatory to Home with (Self).    Opportunity for questions and clarification provided.       Patient given 0 scripts.         To continue your aftercare when you leave the hospital, you may receive an automated call from our care team to check in on how you are doing.  This is a free service and part of our promise to provide the best care and service to meet your aftercare needs." If you have questions, or wish to unsubscribe from this service please call [phone].  Thank you for Choosing our [facility] Emergency Department.

## 2021-01-19 NOTE — ED Provider Notes (Signed)
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  He denies any auditory or visual hallucinations.  He denies any known family history of psychiatric illnesses.  No personal history of psychiatric illness, but admits to having some anxiety and depression.  He uses marijuana, but denies any other drug use.  Denies any daily alcohol use, but drank some tonight.  Patient states he came here to "just relax and chill, I didn't even want to come here."  Denies any active suicidal thoughts and states "you do not think about suicide, you just do it."  No plan.  No prior history of suicide attempts.      Alcohol intoxication  Primary symptoms include: intoxication.  There areno confusion and no hallucinations present at this time.  Pertinent negatives include no fever, no nausea and no vomiting.        Past Medical History:   Diagnosis Date   ??? Hypertension        Past Surgical History:   Procedure Laterality Date   ??? HX HEENT      jaw   ??? HX ORTHOPAEDIC           History reviewed. No pertinent family history.    Social History     Socioeconomic History   ??? Marital status: SINGLE     Spouse name: Not on file   ??? Number of children: Not on file   ??? Years of education: Not on file   ??? Highest education level: Not on file   Occupational History   ??? Not on file   Tobacco Use   ??? Smoking status: Current Every Day Smoker     Packs/day: 0.25   ??? Smokeless tobacco: Never Used   Substance and Sexual Activity   ??? Alcohol use: Yes   ??? Drug use: Yes     Types: Marijuana   ??? Sexual activity: Not on file   Other Topics Concern   ??? Not on file   Social History Narrative   ??? Not on file     Social Determinants of Health     Financial Resource Strain:    ??? Difficulty of Paying Living Expenses: Not on file   Food Insecurity:    ??? Worried About Running Out of Food in the Last Year: Not on file   ??? Ran Out of Food in the  Last Year: Not on file   Transportation Needs:    ??? Lack of Transportation (Medical): Not on file   ??? Lack of Transportation (Non-Medical): Not on file   Physical Activity:    ??? Days of Exercise per Week: Not on file   ??? Minutes of Exercise per Session: Not on file   Stress:    ??? Feeling of Stress : Not on file   Social Connections:    ??? Frequency of Communication with Friends and Family: Not on file   ??? Frequency of Social Gatherings with Friends and Family: Not on file   ??? Attends Religious Services: Not on file   ??? Active Member of Clubs or Organizations: Not on file   ??? Attends Banker Meetings: Not on file   ??? Marital Status: Not on file   Intimate Partner Violence:    ??? Fear of Current or Ex-Partner: Not on file   ??? Emotionally Abused: Not on file   ??? Physically Abused: Not  on file   ??? Sexually Abused: Not on file   Housing Stability:    ??? Unable to Pay for Housing in the Last Year: Not on file   ??? Number of Places Lived in the Last Year: Not on file   ??? Unstable Housing in the Last Year: Not on file         ALLERGIES: Patient has no known allergies.    Review of Systems   Constitutional: Negative for fever.   HENT: Negative for hearing loss.    Eyes: Negative for visual disturbance.   Respiratory: Negative for cough and shortness of breath.    Cardiovascular: Negative for chest pain.   Gastrointestinal: Negative for abdominal pain, diarrhea, nausea and vomiting.   Musculoskeletal: Negative for back pain.   Skin: Negative for rash.   Neurological: Negative for headaches.   Psychiatric/Behavioral: Negative for confusion and hallucinations.   All other systems reviewed and are negative.      Vitals:    01/19/21 2133   BP: (!) 143/77   Pulse: 86   Resp: 18   Temp: 98.2 ??F (36.8 ??C)   SpO2: 98%   Weight: 68 kg (150 lb)   Height: 5\' 9"  (1.753 m)            Physical Exam  Vitals and nursing note reviewed.   Constitutional:       Appearance: Normal appearance.   HENT:      Head: Normocephalic and  atraumatic.      Nose: Nose normal.      Mouth/Throat:      Mouth: Mucous membranes are moist.   Eyes:      Pupils: Pupils are equal, round, and reactive to light.   Cardiovascular:      Rate and Rhythm: Normal rate and regular rhythm.   Pulmonary:      Effort: Pulmonary effort is normal.      Breath sounds: No stridor.   Abdominal:      General: Abdomen is flat.   Musculoskeletal:         General: No deformity. Normal range of motion.      Cervical back: Normal range of motion and neck supple.   Skin:     General: Skin is warm and dry.   Neurological:      General: No focal deficit present.      Mental Status: He is alert. Mental status is at baseline.   Psychiatric:         Speech: Speech is delayed.         Behavior: Behavior is uncooperative.         Thought Content: Thought content is not delusional. Thought content does not include homicidal or suicidal ideation. Thought content does not include homicidal or suicidal plan.          MDM  Number of Diagnoses or Management Options  Depression, unspecified depression type  Diagnosis management comments: Parts of this document were created using dragon voice recognition software. The chart has been reviewed but errors may still be present.  I wore appropriate PPE throughout this patient's ED visit. , MD, 11:55 PM    Not cooperative with questioning.  States he wants to sleep.  Discussed we would have a psychiatrist speak to him to address his mental health.  He states he does not want to speak to a psychiatrist.  Will discharge to follow up at [facility].     I discussed the  results of all labs, procedures, radiographs, and treatments with the patient and available family.  Treatment plan is agreed upon and the patient is ready for discharge.  Questions about treatment in the ED and differential diagnosis of presenting condition were answered.  Patient was given verbal discharge instructions including, but not limited to, importance of  returning to the emergency department for any concern of worsening or continued symptoms.  Instructions were given to follow up with a primary care provider or specialist within 1-2 days.  Adverse effects of medications, if prescribed, were discussed and patient was advised to refrain from significant physical activity until followed up by primary care physician and to not drive or operate heavy machinery after taking any sedating substances.             Procedures

## 2021-01-19 NOTE — ED Notes (Deleted)
Patient is a 40 year old male who presents with suicidal ideation.  Drowsy but awake in triage.  Stable vital signs.  Heart with regular rate and rhythm.  Lungs clear to auscultate.    Patient evaluated initially in triage.  Rapid Medical Evaluation was conducted and necessary orders have been placed.  I have performed a medical screening exam.  Care will now be transferred to the provider in the back of the emergency department.  Carlynn Purl, PA 9:32 PM

## 2021-01-19 NOTE — ED Notes (Signed)
Pt found by EMS at a [employer] outside drinking a beer. States the pt called EMS himself because he was having SI thoughts, with no specific plan. Pt denies HI ideations, auditory or visual hallucinations

## 2021-01-20 ENCOUNTER — Inpatient Hospital Stay
Admit: 2021-01-20 | Discharge: 2021-01-20 | Disposition: A | Discharge status: Home / Self Care | Attending: Emergency Medicine

## 2021-01-20 ENCOUNTER — Inpatient Hospital Stay: Admit: 2021-01-20 | Discharge: 2021-01-20

## 2021-01-20 DIAGNOSIS — E860 Dehydration: Secondary | ICD-10-CM

## 2021-01-20 DIAGNOSIS — Z5321 Procedure and treatment not carried out due to patient leaving prior to being seen by health care provider: Secondary | ICD-10-CM

## 2021-01-20 NOTE — ED Provider Notes (Signed)
Mask was worn during the entire patient examination.    Jamie Casey is a 40 y.o. male who presents to the ED with a chief complaint of dehydration.  Patient states that he has been very weak and feels like he is getting dehydrated.  He was walking a lot today and not hydrating as much as usual.  He feels like something could be wrong given his degree of dehydration.  His lips are chapped as well.  Also complains of some knee pain bilaterally but worse in the right leg.  States he has some arthritis.  Does admit to smoking marijuana today but denies any alcohol use.  Denies any suicidal thoughts.    The history is provided by the patient.   Dehydration  Pertinent negatives include no chest pain, no abdominal pain and no shortness of breath.        Past Medical History:   Diagnosis Date   ??? Hypertension        Past Surgical History:   Procedure Laterality Date   ??? HX HEENT      jaw   ??? HX ORTHOPAEDIC           History reviewed. No pertinent family history.    Social History     Socioeconomic History   ??? Marital status: SINGLE     Spouse name: Not on file   ??? Number of children: Not on file   ??? Years of education: Not on file   ??? Highest education level: Not on file   Occupational History   ??? Not on file   Tobacco Use   ??? Smoking status: Current Every Day Smoker     Packs/day: 0.25   ??? Smokeless tobacco: Never Used   Substance and Sexual Activity   ??? Alcohol use: Yes   ??? Drug use: Yes     Types: Marijuana   ??? Sexual activity: Not on file   Other Topics Concern   ??? Not on file   Social History Narrative   ??? Not on file     Social Determinants of Health     Financial Resource Strain:    ??? Difficulty of Paying Living Expenses: Not on file   Food Insecurity:    ??? Worried About Running Out of Food in the Last Year: Not on file   ??? Ran Out of Food in the Last Year: Not on file   Transportation Needs:    ??? Lack of Transportation (Medical): Not on file   ??? Lack of Transportation (Non-Medical): Not on file   Physical  Activity:    ??? Days of Exercise per Week: Not on file   ??? Minutes of Exercise per Session: Not on file   Stress:    ??? Feeling of Stress : Not on file   Social Connections:    ??? Frequency of Communication with Friends and Family: Not on file   ??? Frequency of Social Gatherings with Friends and Family: Not on file   ??? Attends Religious Services: Not on file   ??? Active Member of Clubs or Organizations: Not on file   ??? Attends Archivist Meetings: Not on file   ??? Marital Status: Not on file   Intimate Partner Violence:    ??? Fear of Current or Ex-Partner: Not on file   ??? Emotionally Abused: Not on file   ??? Physically Abused: Not on file   ??? Sexually Abused: Not on file   Housing Stability:    ???  Unable to Pay for Housing in the Last Year: Not on file   ??? Number of Places Lived in the Last Year: Not on file   ??? Unstable Housing in the Last Year: Not on file         ALLERGIES: Patient has no known allergies.    Review of Systems   Constitutional: Negative for activity change, chills and fever.   Respiratory: Negative for apnea, cough, chest tightness, shortness of breath, wheezing and stridor.    Cardiovascular: Negative for chest pain and palpitations.   Gastrointestinal: Negative for abdominal pain, diarrhea, nausea and vomiting.   Musculoskeletal: Negative for arthralgias, neck pain and neck stiffness.   Skin: Negative for pallor and rash.   Neurological: Negative for dizziness, facial asymmetry, weakness and numbness.   Psychiatric/Behavioral: Negative for agitation. The patient is not nervous/anxious.    All other systems reviewed and are negative.      There were no vitals filed for this visit.         Physical Exam  Vitals and nursing note reviewed.   Constitutional:       General: He is not in acute distress.     Appearance: Normal appearance. He is well-developed. He is not ill-appearing or toxic-appearing.   HENT:      Head: Normocephalic and atraumatic.   Eyes:      Conjunctiva/sclera: Conjunctivae  normal.   Cardiovascular:      Rate and Rhythm: Normal rate and regular rhythm.      Heart sounds: No murmur heard.  No friction rub. No gallop.    Pulmonary:      Effort: Pulmonary effort is normal. No tachypnea, accessory muscle usage or respiratory distress.      Breath sounds: No stridor. No decreased breath sounds, wheezing, rhonchi or rales.   Abdominal:      General: Abdomen is flat.      Tenderness: There is no abdominal tenderness. There is no guarding or rebound.      Hernia: No hernia is present.   Musculoskeletal:      Cervical back: No rigidity.   Skin:     General: Skin is warm and dry.      Capillary Refill: Capillary refill takes less than 2 seconds.   Neurological:      General: No focal deficit present.      Mental Status: He is alert and oriented to person, place, and time.      Cranial Nerves: No cranial nerve deficit.   Psychiatric:         Mood and Affect: Mood normal.         Behavior: Behavior normal.          MDM  Number of Diagnoses or Management Options  Diagnosis management comments: Patient has relatively normal labs and electrolytes.  I am giving him IV hydration now.  Clinical exam is normal.  Will dc with out patient followup after hydration.    Freddi Che, MD; 01/20/2021 '@11' :58 PM Voice dictation software was used during the making of this note.  This software is not perfect and grammatical and other typographical errors may be present.  This note has not been proofread for errors.  ====================================        Amount and/or Complexity of Data Reviewed  Clinical lab tests: ordered and reviewed (Results for orders placed or performed during the hospital encounter of 01/20/21  -CBC WITH AUTOMATED DIFF:        Result  Value             Ref Range           WBC                         5.5               4.3 - 11.1 K*       RBC                         4.01 (L)          4.23 - 5.6 M*       HGB                         13.1 (L)          13.6 - 17.2 *        HCT                         38.6 (L)          41.1 - 50.3 %       MCV                         96.3              79.6 - 97.8 *       MCH                         32.7              26.1 - 32.9 *       MCHC                        33.9              31.4 - 35.0 *       RDW                         13.5              11.9 - 14.6 %       PLATELET                    279               150 - 450 K/*       MPV                         9.2 (L)           9.4 - 12.3 FL       ABSOLUTE NRBC               0.00              0.0 - 0.2 K/*       DF                          AUTOMATED                             NEUTROPHILS  40 (L)            43 - 78 %           LYMPHOCYTES                 50 (H)            13 - 44 %           MONOCYTES                   8                 4.0 - 12.0 %        EOSINOPHILS                 2                 0.5 - 7.8 %         BASOPHILS                   1                 0.0 - 2.0 %         IMMATURE GRANULOCYTES       0                 0.0 - 5.0 %         ABS. NEUTROPHILS            2.2               1.7 - 8.2 K/*       ABS. LYMPHOCYTES            2.7               0.5 - 4.6 K/*       ABS. MONOCYTES              0.4               0.1 - 1.3 K/*       ABS. EOSINOPHILS            0.1               0.0 - 0.8 K/*       ABS. BASOPHILS              0.0               0.0 - 0.2 K/*       ABS. IMM. GRANS.            0.0               0.0 - 0.5 K/*  -METABOLIC PANEL, COMPREHENSIVE:        Result                      Value             Ref Range           Sodium                      138               136 - 145 mm*       Potassium  4.1               3.5 - 5.1 mm*       Chloride                    102               98 - 107 mmo*       CO2                         32                21 - 32 mmol*       Anion gap                   4 (L)             7 - 16 mmol/L       Glucose                     109 (H)           65 - 100 mg/*       BUN                         21                6 - 23 MG/DL         Creatinine                  1.30              0.8 - 1.5 MG*       GFR est AA                  >60               >60 ml/min/1*       GFR est non-AA              >60               >60 ml/min/1*       Calcium                     9.1               8.3 - 10.4 M*       Bilirubin, total            0.5               0.2 - 1.1 MG*       ALT (SGPT)                  77 (H)            12 - 65 U/L         AST (SGOT)                  62 (H)            15 - 37 U/L         Alk. phosphatase            66                50 - 136 U/L        Protein, total  7.7               6.3 - 8.2 g/*       Albumin                     3.9               3.5 - 5.0 g/*       Globulin                    3.8 (H)           2.3 - 3.5 g/*       A-G Ratio                   1.0 (L)           1.2 - 3.5      -MAGNESIUM:        Result                      Value             Ref Range           Magnesium                   2.8 (H)           1.8 - 2.4 mg* )           Procedures

## 2021-01-20 NOTE — ED Notes (Signed)
Pt states he has been binge drinking last weekend and using some drugs and he is feeling dehydrated     Just wants some fluids       Pt denies drinking or using drugs today     Pt further denies having HI or SI

## 2021-01-20 NOTE — ED Notes (Deleted)
Patient presents stating that he thinks he is dehydrated and feels like something is wrong with his body.  Denies any specific complaints.  Poor historian.    Regular rate and rhythm.  Lungs clear to auscultate.    Patient evaluated initially in triage.  Rapid Medical Evaluation was conducted and necessary orders have been placed.  I have performed a medical screening exam.  Care will now be transferred to the provider in the back of the emergency department.  Carlynn Purl, PA 10:38 PM

## 2021-01-21 ENCOUNTER — Inpatient Hospital Stay
Admit: 2021-01-21 | Discharge: 2021-01-21 | Disposition: A | Discharge status: Home / Self Care | Attending: Emergency Medicine

## 2021-01-21 LAB — METABOLIC PANEL, COMPREHENSIVE
A-G Ratio: 1 — ABNORMAL LOW (ref 1.2–3.5)
ALT (SGPT): 77 U/L — ABNORMAL HIGH (ref 12–65)
AST (SGOT): 62 U/L — ABNORMAL HIGH (ref 15–37)
Albumin: 3.9 g/dL (ref 3.5–5.0)
Alk. phosphatase: 66 U/L (ref 50–136)
Anion gap: 4 mmol/L — ABNORMAL LOW (ref 7–16)
BUN: 21 MG/DL (ref 6–23)
Bilirubin, total: 0.5 MG/DL (ref 0.2–1.1)
CO2: 32 mmol/L (ref 21–32)
Calcium: 9.1 MG/DL (ref 8.3–10.4)
Chloride: 102 mmol/L (ref 98–107)
Creatinine: 1.3 MG/DL (ref 0.8–1.5)
GFR est AA: >60 mL/min/{1.73_m2} (ref >60)
GFR est non-AA: >60 mL/min/{1.73_m2} (ref >60)
Globulin: 3.8 g/dL — ABNORMAL HIGH (ref 2.3–3.5)
Glucose: 109 mg/dL — ABNORMAL HIGH (ref 65–100)
Potassium: 4.1 mmol/L (ref 3.5–5.1)
Protein, total: 7.7 g/dL (ref 6.3–8.2)
Sodium: 138 mmol/L (ref 136–145)

## 2021-01-21 LAB — CBC WITH AUTOMATED DIFF
ABS. BASOPHILS: 0 10*3/uL (ref 0.0–0.2)
ABS. EOSINOPHILS: 0.1 10*3/uL (ref 0.0–0.8)
ABS. IMM. GRANS.: 0 10*3/uL (ref 0.0–0.5)
ABS. LYMPHOCYTES: 2.7 10*3/uL (ref 0.5–4.6)
ABS. MONOCYTES: 0.4 10*3/uL (ref 0.1–1.3)
ABS. NEUTROPHILS: 2.2 10*3/uL (ref 1.7–8.2)
ABSOLUTE NRBC: 0 10*3/uL (ref 0.0–0.2)
BASOPHILS: 1 % (ref 0.0–2.0)
EOSINOPHILS: 2 % (ref 0.5–7.8)
HCT: 38.6 % — ABNORMAL LOW (ref 41.1–50.3)
HGB: 13.1 g/dL — ABNORMAL LOW (ref 13.6–17.2)
IMMATURE GRANULOCYTES: 0 % (ref 0.0–5.0)
LYMPHOCYTES: 50 % — ABNORMAL HIGH (ref 13–44)
MCH: 32.7 PG (ref 26.1–32.9)
MCHC: 33.9 g/dL (ref 31.4–35.0)
MCV: 96.3 FL (ref 79.6–97.8)
MONOCYTES: 8 % (ref 4.0–12.0)
MPV: 9.2 FL — ABNORMAL LOW (ref 9.4–12.3)
NEUTROPHILS: 40 % — ABNORMAL LOW (ref 43–78)
PLATELET: 279 10*3/uL (ref 150–450)
RBC: 4.01 M/uL — ABNORMAL LOW (ref 4.23–5.6)
RDW: 13.5 % (ref 11.9–14.6)
WBC: 5.5 10*3/uL (ref 4.3–11.1)

## 2021-01-21 LAB — MAGNESIUM
Magnesium: 2.8 mg/dL — ABNORMAL HIGH (ref 1.8–2.4)
Magnesium: 2.8 mg/dL — ABNORMAL HIGH (ref 1.8–2.4)

## 2021-01-21 LAB — COMPREHENSIVE METABOLIC PANEL
ALT: 77 U/L — ABNORMAL HIGH (ref 12–65)
AST: 62 U/L — ABNORMAL HIGH (ref 15–37)
Albumin/Globulin Ratio: 1 — ABNORMAL LOW (ref 1.2–3.5)
Albumin: 3.9 g/dL (ref 3.5–5.0)
Alkaline Phosphatase: 66 U/L (ref 50–136)
Anion Gap: 4 mmol/L — ABNORMAL LOW (ref 7–16)
BUN: 21 MG/DL (ref 6–23)
CO2: 32 mmol/L (ref 21–32)
Calcium: 9.1 MG/DL (ref 8.3–10.4)
Chloride: 102 mmol/L (ref 98–107)
Creatinine: 1.3 MG/DL (ref 0.8–1.5)
EGFR IF NonAfrican American: >60 mL/min/{1.73_m2} (ref >60)
GFR African American: >60 mL/min/{1.73_m2} (ref >60)
Globulin: 3.8 g/dL — ABNORMAL HIGH (ref 2.3–3.5)
Glucose: 109 mg/dL — ABNORMAL HIGH (ref 65–100)
Potassium: 4.1 mmol/L (ref 3.5–5.1)
Sodium: 138 mmol/L (ref 136–145)
Total Bilirubin: 0.5 MG/DL (ref 0.2–1.1)
Total Protein: 7.7 g/dL (ref 6.3–8.2)

## 2021-01-21 LAB — CBC WITH AUTO DIFFERENTIAL
Basophils %: 1 % (ref 0.0–2.0)
Basophils Absolute: 0 10*3/uL (ref 0.0–0.2)
Eosinophils %: 2 % (ref 0.5–7.8)
Eosinophils Absolute: 0.1 10*3/uL (ref 0.0–0.8)
Granulocyte Absolute Count: 0 10*3/uL (ref 0.0–0.5)
Hematocrit: 38.6 % — ABNORMAL LOW (ref 41.1–50.3)
Hemoglobin: 13.1 g/dL — ABNORMAL LOW (ref 13.6–17.2)
Immature Granulocytes: 0 % (ref 0.0–5.0)
Lymphocytes %: 50 % — ABNORMAL HIGH (ref 13–44)
Lymphocytes Absolute: 2.7 10*3/uL (ref 0.5–4.6)
MCH: 32.7 PG (ref 26.1–32.9)
MCHC: 33.9 g/dL (ref 31.4–35.0)
MCV: 96.3 FL (ref 79.6–97.8)
MPV: 9.2 FL — ABNORMAL LOW (ref 9.4–12.3)
Monocytes %: 8 % (ref 4.0–12.0)
Monocytes Absolute: 0.4 10*3/uL (ref 0.1–1.3)
NRBC Absolute: 0 10*3/uL (ref 0.0–0.2)
Neutrophils %: 40 % — ABNORMAL LOW (ref 43–78)
Neutrophils Absolute: 2.2 10*3/uL (ref 1.7–8.2)
Platelets: 279 10*3/uL (ref 150–450)
RBC: 4.01 M/uL — ABNORMAL LOW (ref 4.23–5.6)
RDW: 13.5 % (ref 11.9–14.6)
WBC: 5.5 10*3/uL (ref 4.3–11.1)

## 2021-01-21 MED ORDER — SODIUM CHLORIDE 0.9% BOLUS IV
0.9 % | Freq: Once | INTRAVENOUS | Status: AC
Start: 2021-01-21 — End: 2021-01-21
  Administered 2021-01-21: 03:00:00 via INTRAVENOUS

## 2021-01-21 NOTE — ED Notes (Signed)
 I have reviewed discharge instructions with the patient.  The patient verbalized understanding.    Patient left ED via Discharge Method: ambulatory to Home with (Self).    Opportunity for questions and clarification provided.       Patient given 0 scripts.         To continue your aftercare when you leave the hospital, you may receive an automated call from our care team to check in on how you are doing.  This is a free service and part of our promise to provide the best care and service to meet your aftercare needs." If you have questions, or wish to unsubscribe from this service please call [phone].  Thank you for Choosing our [facility] Emergency Department.

## 2021-03-24 ENCOUNTER — Inpatient Hospital Stay: Admit: 2021-03-24 | Discharge: 2021-03-24

## 2021-03-24 NOTE — ED Notes (Signed)
Pt called for triage x2, no answer.      Arvil Persons, RN  03/24/21 [phone]

## 2021-03-24 NOTE — ED Notes (Signed)
Pt called to triage, no answer at this time.     Arvil Persons, RN  03/24/21 [phone]

## 2021-03-24 NOTE — ED Notes (Signed)
Pt remains no answer for triage.     Arvil Persons, RN  03/24/21 [phone]

## 2021-06-12 ENCOUNTER — Inpatient Hospital Stay
Admit: 2021-06-12 | Discharge: 2021-06-12 | Disposition: A | Discharge status: Home / Self Care | Attending: Emergency Medicine

## 2021-06-12 DIAGNOSIS — F1490 Cocaine use, unspecified, uncomplicated: Secondary | ICD-10-CM

## 2021-06-12 LAB — COMPREHENSIVE METABOLIC PANEL
ALT: 25 U/L (ref 12–65)
AST: 23 U/L (ref 15–37)
Albumin/Globulin Ratio: 1.3 (ref 1.2–3.5)
Albumin: 4 g/dL (ref 3.5–5.0)
Alk Phosphatase: 67 U/L (ref 50–136)
Anion Gap: 2 mmol/L — ABNORMAL LOW (ref 4–13)
BUN: 12 MG/DL (ref 6–23)
CO2: 32 mmol/L (ref 21–32)
Calcium: 9.2 MG/DL (ref 8.3–10.4)
Chloride: 105 mmol/L (ref 101–110)
Creatinine: 1.2 MG/DL (ref 0.8–1.5)
GFR African American: >60 mL/min/{1.73_m2} (ref >60)
GFR Non-African American: >60 mL/min/{1.73_m2} (ref >60)
Globulin: 3 g/dL (ref 2.3–3.5)
Glucose: 104 mg/dL — ABNORMAL HIGH (ref 65–100)
Potassium: 3.7 mmol/L (ref 3.5–5.1)
Sodium: 139 mmol/L (ref 138–145)
Total Bilirubin: 0.4 MG/DL (ref 0.2–1.1)
Total Protein: 7 g/dL (ref 6.3–8.2)

## 2021-06-12 LAB — CBC WITH AUTO DIFFERENTIAL
Absolute Eos #: 0.2 10*3/uL (ref 0.0–0.8)
Absolute Immature Granulocyte: 0 10*3/uL (ref 0.0–0.5)
Absolute Lymph #: 2.1 10*3/uL (ref 0.5–4.6)
Absolute Mono #: 0.3 10*3/uL (ref 0.1–1.3)
Basophils Absolute: 0.1 10*3/uL (ref 0.0–0.2)
Basophils: 1 % (ref 0.0–2.0)
Eosinophils %: 3 % (ref 0.5–7.8)
Hematocrit: 40.2 % — ABNORMAL LOW (ref 41.1–50.3)
Hemoglobin: 13.7 g/dL (ref 13.6–17.2)
Immature Granulocytes: 0 % (ref 0.0–5.0)
Lymphocytes: 43 % (ref 13–44)
MCH: 33.4 PG — ABNORMAL HIGH (ref 26.1–32.9)
MCHC: 34.1 g/dL (ref 31.4–35.0)
MCV: 98 FL — ABNORMAL HIGH (ref 79.6–97.8)
MPV: 9.4 FL (ref 9.4–12.3)
Monocytes: 6 % (ref 4.0–12.0)
Platelets: 279 10*3/uL (ref 150–450)
RBC: 4.1 M/uL — ABNORMAL LOW (ref 4.23–5.6)
RDW: 13.5 % (ref 11.9–14.6)
Seg Neutrophils: 47 % (ref 43–78)
Segs Absolute: 2.4 10*3/uL (ref 1.7–8.2)
WBC: 5 10*3/uL (ref 4.3–11.1)
nRBC: 0 10*3/uL (ref 0.0–0.2)

## 2021-06-12 LAB — SALICYLATE LEVEL: Salicylate, Serum: 2.5 MG/DL — ABNORMAL LOW (ref 2.8–20.0)

## 2021-06-12 LAB — ACETAMINOPHEN LEVEL: Acetaminophen Level: <2 ug/mL — ABNORMAL LOW (ref 10.0–30.0)

## 2021-06-12 NOTE — Progress Notes (Signed)
Patient resting in bed in no acute distress.  Patient states that he is homeless.  Denies SI, HI, AVH, paranoid delusions.  Case management consulted and meeting with patient.  Patient denies wanting to hurt himself or others.  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 [facility] consulted. Case management to arrange follow-up/support.

## 2021-06-12 NOTE — ED Triage Notes (Signed)
Pt to ED via [facility] for withdrawal from cocaine that he last used a few hours ago and meth that he used x2 days ago. Pt states ETOH use today as well. Pt states "I need to do this so I can get into this program." Pt denies pain at this time. Pt states his withdrawal symptom is depression. Pt is mildly cooperative at this time, will not make eye contact and reluctant to give full answers.

## 2021-06-12 NOTE — Care Coordination-Inpatient (Signed)
06/12/21 1118   Service Assessment   Patient Orientation Alert and Oriented   Cognition Alert   Primary Caregiver Self   Support Systems None   CM/SW Referral Other (see comment)  (Substance use)   Social/Functional History   ADL Assistance Independent   Homemaking Assistance Independent   Ambulation Assistance Independent   Mode of Transportation Walk   Services At/After Discharge   Transition of Care Consult (CM Consult) Discharge Planning   Condition of Participation: Discharge Planning   The Patient and/or Patient Representative was provided with a Choice of Provider? Patient   The Patient and/Or Patient Representative agree with the Discharge Plan? Yes   Freedom of Choice list was provided with basic dialogue that supports the patient's individualized plan of care/goals, treatment preferences, and shares the quality data associated with the providers?  Yes       CM met with patient at bedside. Patient states he has been trying to get clean. Reports recent drug and alcohol use. Denies SI HI.   Offered assistance with [facility] and sober living options.   Spoke with pt about current struggles with addiction and the ill effects it has on one's health/wellbeing and how it effects those closest to them. I have provided pt with a "addiction packet" . I have put together a packet that is compiled with information about the following :[place], [facility], [employer], [facility], [facility], [place] and [place]. Opportunities for questions/concerns were given.   Patient given cell phone to speak with overcomers.       Patient left with out speaking to [facility]. No further CM needs.

## 2021-06-12 NOTE — ED Notes (Signed)
Patient resting in bed, respirations present. Patient with no needs states at this time.      Lowry Bowl, RN  06/12/21 [phone]

## 2021-06-12 NOTE — ED Provider Notes (Signed)
Emergency Department Provider Note                   PCP:                PROVIDER Crissie Figures, MD               Age: 40 y.o.      Sex: male       ICD-10-CM    1. Cocaine use  F14.90       2. 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 Suicidal ideation  R45.851           DISPOSITION          MDM  Number of Diagnoses or Management Options  Cocaine use  History of methamphetamine use  Suicidal ideation  Diagnosis management comments: 40 year old male presents for wanting resources to help with drug problems.  Patient states that he uses cocaine and methamphetamines.  He admits to using cocaine earlier this morning.  Patient states that he is currently suicidal as well as having auditory hallucinations.  However he does not have a current plan.  This point basic BMS orders were initiated I will not place the patient on an involuntary hold.  I do think the patient should be assessed by case management in the morning to see if they can help with finding resources for placement for drug addictions.               Orders Placed This Encounter   Procedures    CBC with Auto Differential    Comprehensive Metabolic Panel    Acetaminophen Level    Salicylate    Urine Drug Screen        Medications - No data to display    New Prescriptions    No medications on file        Wilburt Messina is a 40 y.o. male who presents to the Emergency Department with chief complaint of    Chief Complaint   Patient presents with    Withdrawal      40 year old male presents to the emerged department via EMS with chief complaint of cocaine and methamphetamine use.  Patient states that he is wanting help on getting into an addiction center.  He states that he is wanting to quit taking these medications is going to ruin his life.  Patient states that he is having suicidal ideations as well as hallucinations.  He does not have any current plan to hurt himself.          Review of Systems   Constitutional:  Negative for chills, fatigue and fever.    HENT:  Negative for congestion, dental problem and drooling.    Eyes:  Negative for discharge.   Respiratory:  Negative for cough, chest tightness and shortness of breath.    Cardiovascular:  Negative for chest pain and palpitations.   Gastrointestinal:  Negative for abdominal pain, diarrhea, nausea and vomiting.   Endocrine: Negative for polydipsia.   Genitourinary:  Negative for difficulty urinating, dysuria and hematuria.   Musculoskeletal:  Negative for back pain.   Skin:  Negative for rash and wound.   Neurological:  Negative for dizziness, seizures, speech difficulty, light-headedness and headaches.   Psychiatric/Behavioral:  Negative for agitation.      Past Medical History:   Diagnosis Date    Hypertension         Past Surgical History:   Procedure Laterality Date  HEENT      jaw    ORTHOPEDIC SURGERY          No family history on file.     Social History     Socioeconomic History    Marital status: Single   Tobacco Use    Smoking status: Every Day     Packs/day: 0.25     Types: Cigarettes    Smokeless tobacco: Never   Substance and Sexual Activity    Alcohol use: Yes    Drug use: Yes     Types: Marijuana [name]         Patient has no known allergies.     Previous Medications    No medications on file        Vitals signs and nursing note reviewed.   Patient Vitals for the past 4 hrs:   Temp Pulse Resp BP SpO2   06/12/21 0153 97.9 ??F (36.6 ??C) 82 18 120/79 98 %          Physical Exam  Vitals and nursing note reviewed.   Constitutional:       Comments: Dirty in appearance   HENT:      Head: Normocephalic and atraumatic.      Right Ear: Tympanic membrane normal.      Nose: Nose normal.      Mouth/Throat:      Mouth: Mucous membranes are moist.   Eyes:      Extraocular Movements: Extraocular movements intact.      Pupils: Pupils are equal, round, and reactive to light.   Cardiovascular:      Rate and Rhythm: Normal rate and regular rhythm.      Heart sounds: No murmur heard.  Pulmonary:      Effort:  Pulmonary effort is normal. No respiratory distress.      Breath sounds: Normal breath sounds.   Abdominal:      General: There is no distension.      Palpations: Abdomen is soft.      Tenderness: There is no abdominal tenderness. There is no guarding.   Musculoskeletal:         General: No swelling or tenderness. Normal range of motion.      Cervical back: Normal range of motion and neck supple. No rigidity or tenderness.   Skin:     General: Skin is warm and dry.      Capillary Refill: Capillary refill takes less than 2 seconds.   Neurological:      General: No focal deficit present.      Mental Status: He is alert and oriented to person, place, and time. Mental status is at baseline.   Psychiatric:         Behavior: Behavior normal.        Procedures    No results found for any visits on 06/12/21.     No orders to display                       Voice dictation software was used during the making of this note.  This software is not perfect and grammatical and other typographical errors may be present.  This note has not been completely proofread for errors.     Glendell Docker, DO  06/12/21 [phone]

## 2021-06-12 NOTE — Discharge Instructions (Addendum)
Follow-up with [facility] representative, [facility] mental health.  Return to ED if symptoms worsen or progress in any way.

## 2021-06-12 NOTE — ED Notes (Signed)
Pt left department without paperwork      Dorwin Fitzhenry M Ramiya Delahunty, [place]  06/12/21 1127

## 2021-07-17 ENCOUNTER — Inpatient Hospital Stay
Admit: 2021-07-17 | Discharge: 2021-07-17 | Disposition: A | Discharge status: Home / Self Care | Attending: Emergency Medicine

## 2021-07-17 DIAGNOSIS — L600 Ingrowing nail: Secondary | ICD-10-CM

## 2021-07-17 DIAGNOSIS — F32A Depression, unspecified: Secondary | ICD-10-CM

## 2021-07-17 MED ORDER — CEPHALEXIN 500 MG PO CAPS
500 MG | ORAL_CAPSULE | Freq: Two times a day (BID) | ORAL | 0 refills | Status: AC
Start: 2021-07-17 — End: 2021-07-24

## 2021-07-17 MED ORDER — CEPHALEXIN 500 MG PO CAPS
500 MG | ORAL_CAPSULE | Freq: Two times a day (BID) | ORAL | 0 refills | Status: DC
Start: 2021-07-17 — End: 2021-07-17

## 2021-07-17 NOTE — ED Triage Notes (Signed)
Pt c/o bil foot pain x1 year, states "I think I have an ingrown nail on both my pinky toes."

## 2021-07-17 NOTE — ED Provider Notes (Addendum)
Emergency Department Provider Note                   PCP:                No primary care provider on file.               Age: 40 y.o.      Sex: male       ICD-10-CM    1. Depression with suicidal ideation  F32.A     R45.851       2. Homelessness  Z59.00           DISPOSITION Ed Observation 07/18/2021 07:01:30 AM        MDM  Number of Diagnoses or Management Options  Depression with suicidal ideation: new, needed workup  Homelessness: new, needed workup  Diagnosis management comments: Patient evaluated by psychiatry, felt to be a danger to himself and white papers were completed.  Case to be referred to case management in the a.m.       Amount and/or Complexity of Data Reviewed  Clinical lab tests: ordered and reviewed  Review and summarize past medical records: yes    Risk of Complications, Morbidity, and/or Mortality  Presenting problems: moderate  Diagnostic procedures: minimal  Management options: moderate    Patient Progress  Patient progress: stable       ED Course as of 07/18/21 0703   Sat Jul 18, 2021   0334 ECG interpretation for ECG dated 18 July 2021 at 3:23 AM: ECG reveals normal sinus rhythm rate of 63 bpm, normal PR and QTc intervals and rightward axis.  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  Abnormal ECG.  Daron Offer, MD   [BB]      ED Course User Index  [BB] Daron Offer, MD        Orders Placed This Encounter   Procedures    COVID-19, Rapid    CBC with Auto Differential    CMP    Salicylate    Acetaminophen Level    ETOH    Magnesium    Urine Drug Screen    Urinalysis w rflx microscopic    ADULT DIET; Regular; Safety Tray; Safety Tray (Disposables No Utensils)    Complete CSSRS Screen    Complete SBIRT Screen    POCT Urine Dipstick    Misc nursing order (specify)    EKG 12 Lead    Suicide precautions        Medications - No data to display    New Prescriptions    No medications on file        Arch Methot is a 40 y.o. male who presents to the Emergency Department with chief complaint of     Chief Complaint   Patient presents with    Mental Health Problem      40 year old black male with history of depression on no medications and homeless presents to the emergency department complaining of increasing depression over the last few days with suicidal ideation.  When asked if he was hearing voices he said we all hear voices.  When asked if any of the voices talking to him or threatening or telling him to do anything he declined to answer.  As soon as it was back in the room he was requesting food from the nurse and asking how long he would be able to stay.  Patient does not voice any specific reason why he has become more depressed or  suicidal.    The history is provided by the patient.       Review of Systems   Constitutional:  Negative for chills and fever.   Psychiatric/Behavioral:  Positive for dysphoric mood and suicidal ideas. Negative for agitation and sleep disturbance. The patient is not nervous/anxious.    All other systems reviewed and are negative.    No past medical history on file.     No past surgical history on file.     No family history on file.     Social History     Socioeconomic History    Marital status: Single         Patient has no known allergies.     Previous Medications    No medications on file        Vitals signs and nursing note reviewed.   No data found.         Physical Exam  Vitals and nursing note reviewed.   Constitutional:       General: He is not in acute distress.     Comments: Unkempt black male holding his many Bible in his hand leaving through the pages.   HENT:      Head: Normocephalic and atraumatic.      Right Ear: External ear normal.      Left Ear: External ear normal.      Nose: Nose normal.      Mouth/Throat:      Mouth: Mucous membranes are moist.   Eyes:      Extraocular Movements: Extraocular movements intact.      Conjunctiva/sclera: Conjunctivae normal.      Pupils: Pupils are equal, round, and reactive to light.   Cardiovascular:      Rate and Rhythm:  Normal rate and regular rhythm.      Heart sounds: No murmur heard.  Pulmonary:      Effort: Pulmonary effort is normal.      Breath sounds: Normal breath sounds.   Abdominal:      General: Bowel sounds are normal.      Palpations: Abdomen is soft.      Tenderness: There is no abdominal tenderness.   Musculoskeletal:         General: Normal range of motion.      Cervical back: Normal range of motion and neck supple.   Skin:     General: Skin is warm and dry.   Neurological:      General: No focal deficit present.      Mental Status: He is alert and oriented to person, place, and time.   Psychiatric:         Mood and Affect: Mood is depressed.         Speech: Speech normal.         Behavior: Behavior normal. Behavior is cooperative.         Thought Content: Thought content is not paranoid or delusional. Thought content includes suicidal ideation. Thought content does not include homicidal ideation. Thought content includes suicidal plan. Thought content does not include homicidal plan.         Cognition and Memory: Cognition and memory normal.      Comments: Patient with very poor eye contact, dysphoric mood.        Procedures    Results for orders placed or performed during the hospital encounter of 07/17/21   COVID-19, Rapid    Specimen: Nasopharyngeal   Result Value Ref Range  Source Nasopharyngeal      SARS-CoV-2, Rapid Not detected NOTD     CBC with Auto Differential   Result Value Ref Range    WBC 4.5 4.3 - 11.1 K/uL    RBC 4.25 4.23 - 5.6 M/uL    Hemoglobin 13.9 13.6 - 17.2 g/dL    Hematocrit 40.1 (L) 41.1 - 50.3 %    MCV 94.4 82.0 - 102.0 FL    MCH 32.7 26.1 - 32.9 PG    MCHC 34.7 31.4 - 35.0 g/dL    RDW 13.2 11.9 - 14.6 %    Platelets 260 150 - 450 K/uL    MPV 9.2 (L) 9.4 - 12.3 FL    nRBC 0.00 0.0 - 0.2 K/uL    Differential Type AUTOMATED      Seg Neutrophils 45 43 - 78 %    Lymphocytes 40 13 - 44 %    Monocytes 7 4.0 - 12.0 %    Eosinophils % 7 0.5 - 7.8 %    Basophils 1 0.0 - 2.0 %    Immature  Granulocytes 0 0.0 - 5.0 %    Segs Absolute 2.0 1.7 - 8.2 K/UL    Absolute Lymph # 1.8 0.5 - 4.6 K/UL    Absolute Mono # 0.3 0.1 - 1.3 K/UL    Absolute Eos # 0.3 0.0 - 0.8 K/UL    Basophils Absolute 0.1 0.0 - 0.2 K/UL    Absolute Immature Granulocyte 0.0 0.0 - 0.5 K/UL   CMP   Result Value Ref Range    Sodium 139 133 - 143 mmol/L    Potassium 3.6 3.5 - 5.1 mmol/L    Chloride 107 101 - 110 mmol/L    CO2 26 21 - 32 mmol/L    Anion Gap 6 2 - 11 mmol/L    Glucose 93 65 - 100 mg/dL    BUN REPORT DELAYED,INSTRUMENT MALFUNCTION 6 - 23 MG/DL    Creatinine 1.08 0.8 - 1.5 MG/DL    Est, Glom Filt Rate >60 >60 ml/min/1.63m    Calcium 9.2 8.3 - 10.4 MG/DL    Total Bilirubin 0.4 0.2 - 1.1 MG/DL    ALT 33 12 - 65 U/L    AST 33 15 - 37 U/L    Alk Phosphatase 53 50 - 136 U/L    Total Protein 7.2 6.3 - 8.2 g/dL    Albumin 4.0 3.5 - 5.0 g/dL    Globulin 3.2 g/dL    Albumin/Globulin Ratio 1.3 0.4 - 1.6     Salicylate   Result Value Ref Range    Salicylate, Serum <<7.7(L) 2.8 - 20.0 MG/DL   Acetaminophen Level   Result Value Ref Range    Acetaminophen Level <10 (L) 10 - 30 ug/mL   ETOH   Result Value Ref Range    Ethanol Lvl <3 MG/DL   Magnesium   Result Value Ref Range    Magnesium 2.4 1.8 - 2.4 mg/dL   Urine Drug Screen   Result Value Ref Range    PCP, Urine Negative      Benzodiazepines, Urine Negative      Cocaine, Urine Positive      Amphetamine, Urine Positive      Methadone, Urine Negative      THC, TH-Cannabinol, Urine Positive      Opiates, Urine Negative      Barbiturates, Urine Negative     Urinalysis w rflx microscopic   Result Value Ref Range  Color, UA YELLOW/STRAW      Appearance CLEAR      Specific Gravity, UA 1.032 (H) 1.001 - 1.023      pH, Urine 6.0 5.0 - 9.0      Protein, UA Negative NEG mg/dL    Glucose, UA Negative mg/dL    Ketones, Urine TRACE (A) NEG mg/dL    Bilirubin Urine Negative NEG      Blood, Urine Negative NEG      Urobilinogen, Urine 1.0 0.2 - 1.0 EU/dL    Nitrite, Urine Negative NEG      Leukocyte  Esterase, Urine MODERATE (A) NEG      WBC, UA 50-100 0 /hpf    RBC, UA 0-5 0 /hpf    Epithelial Cells UA 0-5 0 /hpf    BACTERIA, URINE Negative 0 /hpf    Casts 0-2 0 /lpf   EKG 12 Lead   Result Value Ref Range    Ventricular Rate 63 BPM    Atrial Rate 63 BPM    P-R Interval 118 ms    QRS Duration 78 ms    Q-T Interval 416 ms    QTc Calculation (Bazett) 425 ms    P Axis 79 degrees    R Axis 90 degrees    T Axis 66 degrees    Diagnosis Normal sinus rhythm          Voice dictation software was used during the making of this note.  This software is not perfect and grammatical and other typographical errors may be present.  This note has not been completely proofread for errors.     Daron Offer, MD  07/18/21 3382       Daron Offer, MD  07/18/21 [phone]

## 2021-07-17 NOTE — ED Notes (Signed)
Constant Observer Yes - Name: Eilene Ghazi RN   Constant Observer Oriented yes   High risk patients are in line of sight at all times No - q25min checks   Excess equipment/medical supplies not necessary for the care of the patient removed Yes   All sharp or dangerous objects are removed from room: including but not limited to belts, pens & pencils, needles, medications, cosmetics, lighters, matches, nail files, watches, necklaces, glass objects, razors, razor blades, knives, aerosol sprays, drawstring pants, shoes, cords (telephone, call bells, etc.) cleaning wipes or other cleaning items, aluminum cans, not permanently attached wall d??cor Yes   Telephone/cell phone removed as well as TV remote (batteries can be swallowed) Yes   Patient belongings removed and labeled at nurses station Yes   Excess linen is removed from room Yes   All plastic bags are removed from the room and replaced with paper trash bags Yes   Patient is in paper scrubs or appropriate gown and using hospital socks with rubber soles Yes   No metal, hard eating utensils or hard plates are on meal tray Yes   Remove all cleaning agents used by [employer] Yes   If Crucifix is hanging on a nail, remove Crucifix as well as the nail Yes       *If any question above is answered "No," documentation is required.        Pt given permission by other nurse to keep his Bible.     Monia Pouch, RN  07/17/21 [phone]

## 2021-07-17 NOTE — ED Triage Notes (Signed)
Pt was unable to be found in the lobby for several hours and reappeared in the lobby stating he was suicidal and going to jump in front of a train.

## 2021-07-17 NOTE — Discharge Instructions (Addendum)
Keflex twice daily for 7 days.  Please follow-up with podiatry.  Please return for any concerns.

## 2021-07-17 NOTE — ED Triage Notes (Signed)
Pt arrives via EMS c/o of back pain. Pt is homeless and was walking when EMS was called for chest pain but pt denies chest pain. PT wished to ride to [place] because that is where his people are.

## 2021-07-17 NOTE — ED Provider Notes (Signed)
Emergency Department Provider Note                   PCP:                PROVIDER Crissie Figures, MD               Age: 40 y.o.      Sex: male       ICD-10-CM    1. Ingrown nail  L60.0           DISPOSITION Decision To Discharge 07/17/2021 05:20:47 AM        MDM  Number of Diagnoses or Management Options  Ingrown nail: new, needed workup  Risk of Complications, Morbidity, and/or Mortality  Presenting problems: low  Diagnostic procedures: low  Management options: low    Patient Progress  Patient progress: stable             No orders of the defined types were placed in this encounter.       Medications - No data to display    New Prescriptions    CEPHALEXIN (KEFLEX) 500 MG CAPSULE    Take 1 capsule by mouth 2 times daily for 7 days        Jamie Casey is a 40 y.o. male who presents to the Emergency Department with chief complaint of    Chief Complaint   Patient presents with    Toe Pain      40 year old male presents with ingrown toenail to left fifth toe.  Has been ongoing for several months.  Denies injury to the foot.  Often clipped the toenail back but it continues to become painful when he is walking.      Foot Problem  Location:  Toe  Time since incident:  11 months  Injury: no    Toe location:  R little toe  Pain details:     Quality:  Aching    Radiates to:  Does not radiate    Severity:  Mild    Onset quality:  Sudden    Duration:  11 months    Timing:  Constant    Progression:  Worsening  Chronicity:  New  Dislocation: no    Foreign body present:  No foreign bodies  Relieved by:  Nothing  Worsened by:  Nothing  Associated symptoms: no back pain, no decreased ROM, no fatigue, no fever, no itching, no muscle weakness, no neck pain, no numbness, no stiffness, no swelling and no tingling    Risk factors: no concern for non-accidental trauma, no frequent fractures, no known bone disorder, no obesity and no recent illness        Review of Systems   Constitutional:  Negative for activity change, chills, diaphoresis,  fatigue and fever.   HENT:  Negative for congestion, dental problem and drooling.    Respiratory:  Negative for cough, chest tightness, shortness of breath and wheezing.    Cardiovascular:  Negative for chest pain and palpitations.   Gastrointestinal:  Negative for abdominal pain, constipation, diarrhea, nausea and vomiting.   Genitourinary:  Negative for decreased urine volume, difficulty urinating, flank pain, frequency and urgency.   Musculoskeletal:  Negative for back pain, neck pain and stiffness.   Skin:  Negative for color change, itching, rash and wound.   Neurological:  Negative for dizziness, facial asymmetry, weakness and light-headedness.   Psychiatric/Behavioral:  Negative for agitation, behavioral problems and confusion.    All other systems reviewed and are negative.  Past Medical History:   Diagnosis Date    Hypertension         Past Surgical History:   Procedure Laterality Date    HEENT      jaw    ORTHOPEDIC SURGERY          No family history on file.     Social History     Socioeconomic History    Marital status: Single   Tobacco Use    Smoking status: Every Day     Packs/day: 0.25     Types: Cigarettes    Smokeless tobacco: Never   Substance and Sexual Activity    Alcohol use: Yes    Drug use: Yes     Types: Marijuana [name]         Patient has no known allergies.     Previous Medications    No medications on file        Vitals signs and nursing note reviewed.   Patient Vitals for the past 4 hrs:   Temp Pulse Resp BP SpO2   07/17/21 0327 98.5 ??F (36.9 ??C) 99 18 134/81 100 %          Physical Exam  Vitals and nursing note reviewed.   Constitutional:       General: He is not in acute distress.     Appearance: Normal appearance. He is normal weight. He is not ill-appearing, toxic-appearing or diaphoretic.   HENT:      Head: Normocephalic and atraumatic.      Right Ear: External ear normal.      Left Ear: External ear normal.      Nose: Nose normal. No congestion or rhinorrhea.      Mouth/Throat:       Mouth: Mucous membranes are moist.      Pharynx: No oropharyngeal exudate or posterior oropharyngeal erythema.   Eyes:      Extraocular Movements: Extraocular movements intact.      Pupils: Pupils are equal, round, and reactive to light.   Cardiovascular:      Rate and Rhythm: Normal rate and regular rhythm.      Pulses: Normal pulses.      Heart sounds: Normal heart sounds. No murmur heard.    No friction rub. No gallop.   Pulmonary:      Effort: Pulmonary effort is normal. No respiratory distress.      Breath sounds: Normal breath sounds. No stridor. No wheezing, rhonchi or rales.   Chest:      Chest wall: No tenderness.   Musculoskeletal:         General: No swelling, tenderness, deformity or signs of injury. Normal range of motion.      Right lower leg: No edema.      Left lower leg: No edema.      Comments: Ingrown toenail to right fifth toe.  No wound noted.  Amount of erythema   Skin:     General: Skin is warm and dry.      Coloration: Skin is not jaundiced or pale.      Findings: No bruising, erythema, lesion or rash.   Neurological:      Mental Status: He is alert.        Procedures    No results found for any visits on 07/17/21.     No orders to display  Voice dictation software was used during the making of this note.  This software is not perfect and grammatical and other typographical errors may be present.  This note has not been completely proofread for errors.     Haynes Dage, APRN - CNP  07/17/21 0522       Haynes Dage, APRN - CNP  07/17/21 0522

## 2021-07-18 ENCOUNTER — Inpatient Hospital Stay
Admit: 2021-07-18 | Discharge: 2021-07-19 | Disposition: A | Discharge status: Home / Self Care | Attending: Emergency Medicine

## 2021-07-18 LAB — URINALYSIS
BACTERIA, URINE: NEGATIVE /hpf
Bilirubin Urine: NEGATIVE
Blood, Urine: NEGATIVE
Glucose, UA: NEGATIVE mg/dL
Nitrite, Urine: NEGATIVE
Protein, UA: NEGATIVE mg/dL
Specific Gravity, UA: 1.032 — ABNORMAL HIGH (ref 1.001–1.023)
Urobilinogen, Urine: 1 EU/dL (ref 0.2–1.0)
pH, Urine: 6 (ref 5.0–9.0)

## 2021-07-18 LAB — COMPREHENSIVE METABOLIC PANEL
ALT: 33 U/L (ref 12–65)
AST: 33 U/L (ref 15–37)
Albumin/Globulin Ratio: 1.3 (ref 0.4–1.6)
Albumin: 4 g/dL (ref 3.5–5.0)
Alk Phosphatase: 53 U/L (ref 50–136)
Anion Gap: 6 mmol/L (ref 2–11)
CO2: 26 mmol/L (ref 21–32)
Calcium: 9.2 MG/DL (ref 8.3–10.4)
Chloride: 107 mmol/L (ref 101–110)
Creatinine: 1.08 MG/DL (ref 0.8–1.5)
Est, Glom Filt Rate: >60 mL/min/{1.73_m2} (ref >60)
Globulin: 3.2 g/dL
Glucose: 93 mg/dL (ref 65–100)
Potassium: 3.6 mmol/L (ref 3.5–5.1)
Sodium: 139 mmol/L (ref 133–143)
Total Bilirubin: 0.4 MG/DL (ref 0.2–1.1)
Total Protein: 7.2 g/dL (ref 6.3–8.2)

## 2021-07-18 LAB — CBC WITH AUTO DIFFERENTIAL
Absolute Eos #: 0.3 10*3/uL (ref 0.0–0.8)
Absolute Immature Granulocyte: 0 10*3/uL (ref 0.0–0.5)
Absolute Lymph #: 1.8 10*3/uL (ref 0.5–4.6)
Absolute Mono #: 0.3 10*3/uL (ref 0.1–1.3)
Basophils Absolute: 0.1 10*3/uL (ref 0.0–0.2)
Basophils: 1 % (ref 0.0–2.0)
Eosinophils %: 7 % (ref 0.5–7.8)
Hematocrit: 40.1 % — ABNORMAL LOW (ref 41.1–50.3)
Hemoglobin: 13.9 g/dL (ref 13.6–17.2)
Immature Granulocytes: 0 % (ref 0.0–5.0)
Lymphocytes: 40 % (ref 13–44)
MCH: 32.7 PG (ref 26.1–32.9)
MCHC: 34.7 g/dL (ref 31.4–35.0)
MCV: 94.4 FL (ref 82.0–102.0)
MPV: 9.2 FL — ABNORMAL LOW (ref 9.4–12.3)
Monocytes: 7 % (ref 4.0–12.0)
Platelets: 260 10*3/uL (ref 150–450)
RBC: 4.25 M/uL (ref 4.23–5.6)
RDW: 13.2 % (ref 11.9–14.6)
Seg Neutrophils: 45 % (ref 43–78)
Segs Absolute: 2 10*3/uL (ref 1.7–8.2)
WBC: 4.5 10*3/uL (ref 4.3–11.1)
nRBC: 0 10*3/uL (ref 0.0–0.2)

## 2021-07-18 LAB — URINE DRUG SCREEN
Amphetamine, Urine: POSITIVE
Barbiturates, Urine: NEGATIVE
Benzodiazepines, Urine: NEGATIVE
Cocaine, Urine: POSITIVE
Methadone, Urine: NEGATIVE
Opiates, Urine: NEGATIVE
PCP, Urine: NEGATIVE
THC, TH-Cannabinol, Urine: POSITIVE

## 2021-07-18 LAB — ACETAMINOPHEN LEVEL: Acetaminophen Level: <10 ug/mL — ABNORMAL LOW (ref 10–30)

## 2021-07-18 LAB — COVID-19, RAPID: SARS-CoV-2, Rapid: NOT DETECTED

## 2021-07-18 LAB — MAGNESIUM: Magnesium: 2.4 mg/dL (ref 1.8–2.4)

## 2021-07-18 LAB — SALICYLATE LEVEL: Salicylate, Serum: <1.7 MG/DL — ABNORMAL LOW (ref 2.8–20.0)

## 2021-07-18 LAB — ETHANOL: Ethanol Lvl: <3 MG/DL

## 2021-07-18 NOTE — ED Notes (Signed)
Report given to Irving Burton, RN.  No change in assessment     Morton Amy, RN  07/18/21 2106

## 2021-07-18 NOTE — ED Notes (Signed)
Report received from [place], [place].  Pt appears to be resting quietly.     Morton Amy, RN  07/18/21 1314

## 2021-07-18 NOTE — ED Notes (Signed)
Constant Observer Yes Joyce   Constant Observer Oriented yes   High risk patients are in line of sight at all times Yes   Excess equipment/medical supplies not necessary for the care of the patient removed Yes   All sharp or dangerous objects are removed from room: including but not limited to belts, pens & pencils, needles, medications, cosmetics, lighters, matches, nail files, watches, necklaces, glass objects, razors, razor blades, knives, aerosol sprays, drawstring pants, shoes, cords (telephone, call bells, etc.) cleaning wipes or other cleaning items, aluminum cans, not permanently attached wall d??cor Yes   Telephone/cell phone removed as well as TV remote (batteries can be swallowed) Yes   Patient belongings removed and labeled at nurses station Yes   Excess linen is removed from room Yes   All plastic bags are removed from the room and replaced with paper trash bags Yes   Patient is in paper scrubs or appropriate gown and using hospital socks with rubber soles Yes   No metal, hard eating utensils or hard plates are on meal tray Yes   Remove all cleaning agents used by [employer] Yes   If Crucifix is hanging on a nail, remove Crucifix as well as the nail Yes       *If any question above is answered "No," documentation is required.      Jan Fireman, RN  07/18/21 [phone]

## 2021-07-18 NOTE — Care Coordination-Inpatient (Addendum)
Patient chart reviewed by Child psychotherapist. Patient placed on involuntary commitment last night by psychiatry and ER MD. Recommendation for re-consult in 24 hours. Patient is currently homeless and not insured. [facility] does not open until 12pm for referrals. SW will reach out then to place the patient on their wait list.     SW called [facility] admissions line, phone rang with no answer. Voicemail not available. SW called again, spoke with Dennie Bible who advised she would need to call SW back. Referral packet prepared with the patient's records as well as a "EMTALA" and COVID screening. This along with ER note, psych report, and IVC papers with vitals and MAR faxed to [facility], [facility], [facility], and [facility]. Awaiting call back from [facility].     Rinaldo Ratel, LMSW  Social Work Case Software engineer. [facility]

## 2021-07-18 NOTE — ED Notes (Signed)
Constant observer at the bedside and requesting a bible     Morton Amy, RN  07/18/21 1315

## 2021-07-18 NOTE — ED Notes (Signed)
Pt appears to be resting quietly at the present time     Morton Amy, RN  07/18/21 [phone]

## 2021-07-18 NOTE — ED Notes (Signed)
Constant Observer yes   Constant Observer Oriented yes   High risk patients are in line of sight at all times Yes   Excess equipment/medical supplies not necessary for the care of the patient removed Yes   All sharp or dangerous objects are removed from room: including but not limited to belts, pens & pencils, needles, medications, cosmetics, lighters, matches, nail files, watches, necklaces, glass objects, razors, razor blades, knives, aerosol sprays, drawstring pants, shoes, cords (telephone, call bells, etc.) cleaning wipes or other cleaning items, aluminum cans, not permanently attached wall d??cor Yes   Telephone/cell phone removed as well as TV remote (batteries can be swallowed) Yes   Patient belongings removed and labeled at nurses station Yes   Excess linen is removed from room Yes   All plastic bags are removed from the room and replaced with paper trash bags Yes   Patient is in paper scrubs or appropriate gown and using hospital socks with rubber soles Yes   No metal, hard eating utensils or hard plates are on meal tray Yes   Remove all cleaning agents used by [employer] Yes   If Crucifix is hanging on a nail, remove Crucifix as well as the nail Yes       *If any question above is answered "No," documentation is required.      Morton Amy, RN  07/18/21 1313

## 2021-07-18 NOTE — ED Notes (Signed)
Report from [place], [place]. Assumed care of pt at this time.       Constant Observer Yes   Constant Observer Oriented yes   High risk patients are in line of sight at all times Yes   Excess equipment/medical supplies not necessary for the care of the patient removed Yes   All sharp or dangerous objects are removed from room: including but not limited to belts, pens & pencils, needles, medications, cosmetics, lighters, matches, nail files, watches, necklaces, glass objects, razors, razor blades, knives, aerosol sprays, drawstring pants, shoes, cords (telephone, call bells, etc.) cleaning wipes or other cleaning items, aluminum cans, not permanently attached wall d??cor Yes   Telephone/cell phone removed as well as TV remote (batteries can be swallowed) Yes   Patient belongings removed and labeled at nurses station Yes   Excess linen is removed from room Yes   All plastic bags are removed from the room and replaced with paper trash bags Yes   Patient is in paper scrubs or appropriate gown and using hospital socks with rubber soles Yes   No metal, hard eating utensils or hard plates are on meal tray Yes   Remove all cleaning agents used by [employer] Yes   If Crucifix is hanging on a nail, remove Crucifix as well as the nail Yes       *If any question above is answered "No," documentation is required.          Myrtie Neither Tomasa Rand, RN  07/18/21 [phone]

## 2021-07-19 LAB — EKG 12-LEAD
Atrial Rate: 63 {beats}/min
Diagnosis: NORMAL
P Axis: 79 degrees
P-R Interval: 118 ms
Q-T Interval: 416 ms
QRS Duration: 78 ms
QTc Calculation (Bazett): 425 ms
R Axis: 90 degrees
T Axis: 66 degrees
Ventricular Rate: 63 {beats}/min

## 2021-07-19 MED ORDER — LIDOCAINE HCL 1% INJ (MIXTURES ONLY)
1 % | Freq: Once | INTRAMUSCULAR | Status: AC
Start: 2021-07-19 — End: 2021-07-19
  Administered 2021-07-19: 14:00:00 500 mg via INTRAMUSCULAR

## 2021-07-19 MED ORDER — DOXYCYCLINE HYCLATE 100 MG PO CAPS
100 MG | Freq: Two times a day (BID) | ORAL | Status: DC
Start: 2021-07-19 — End: 2021-07-19
  Administered 2021-07-19: 14:00:00 100 mg via ORAL

## 2021-07-19 MED ORDER — DOXYCYCLINE HYCLATE 100 MG PO TABS
100 MG | ORAL_TABLET | Freq: Two times a day (BID) | ORAL | 0 refills | Status: AC
Start: 2021-07-19 — End: 2021-07-26

## 2021-07-19 MED FILL — XYLOCAINE 1 % IJ SOLN: 1 % | INTRAMUSCULAR | Qty: 20

## 2021-07-19 MED FILL — CEFTRIAXONE SODIUM 500 MG IJ SOLR: 500 MG | INTRAMUSCULAR | Qty: 500

## 2021-07-19 MED FILL — DOXYCYCLINE HYCLATE 100 MG PO CAPS: 100 MG | ORAL | Qty: 1

## 2021-07-19 NOTE — Care Coordination-Inpatient (Signed)
[facility] to see if pt was still here. I informed them that he had been d/c yesterday afternoon. Pt will be taken off their list.

## 2021-07-19 NOTE — ED Notes (Signed)
Psychiatry consulted to reevaluate patient.  Patient denies SI, HI, AVH.  Patient with no thoughts of wanting to harm himself or others.  Psychiatrist evaluated patient and states that he can be discharged home and referred to a substance abuse program.  Patient given return precautions.  Will have case management meet with patient to help arrange follow-up at substance abuse program and local homeless shelter.     Jaydee Ingman Steger Sheral Flow., MD  07/19/21 1452

## 2021-07-19 NOTE — ED Notes (Signed)
Patient awake; eating lunch.  Tele psych machine in room.  Pt voices no needs       Luellen Pucker, RN  07/19/21 1309

## 2021-07-19 NOTE — ED Notes (Addendum)
Pt seen/treated during Daylight Savings Time change. Some charting may be out of order.      Myrtie Neither Tomasa Rand, RN  07/19/21 0112       Myrtie Neither. Tomasa Rand, RN  07/19/21 [phone]

## 2021-07-19 NOTE — ED Notes (Signed)
Report received from Juan Quam, Charity fundraiser.  Assumed patient care at this time.     Luellen Pucker, RN  07/19/21 1312

## 2021-07-19 NOTE — ED Notes (Signed)
Pt eating breakfast.  No s/sx of distress.  No needs at this time       Luellen Pucker, RN  07/19/21 1110

## 2021-07-19 NOTE — ED Notes (Signed)
Pt sleeping.  No s/sx of distress noted.     Luellen Pucker, RN  07/19/21 1309

## 2021-07-19 NOTE — ED Notes (Signed)
Patient sleeping.  Pt observer at bedside.  telepsych monitor at bedside.  No s/sx of distress.  No needs       Luellen Pucker, RN  07/19/21 [phone]

## 2021-07-19 NOTE — ED Notes (Signed)
Pt quiet in bed.  No s/sx of distress.  No needs verbalized by patient.     Luellen Pucker, RN  07/19/21 [phone]

## 2021-07-19 NOTE — ED Notes (Signed)
Assumed care of patient at 7:00 AM.      40 yo AAM w/ hx of homelessness presents with worsening depression and reported SI. Psychiatry evaluated patient. Patient placed on IVC papers on 07/17/21.    This morning, patient is resting in no acute distress.  Patient denies SI, HI, AVH.  Patient states that he would like to speak with a Child psychotherapist at this time.  Patient was noted to have moderate LE and 50-100 WBCs on UA.  Patient was started on doxycycline.  Urine GC/chlamydia was ordered.  Will also administer Rocephin IM.  Awaiting facility placement at this time.  Will discuss plans with case management. VSS. RRR. Lungs CTAB. Abdomen soft, NTND. No focal deficits.      Georges Victorio Steger Sheral Flow., MD  07/19/21 [phone]

## 2021-07-19 NOTE — Care Coordination-Inpatient (Addendum)
SW met with patient, provided verbal and written information regarding homeless shelters, residential addictions recovery, [place], food pantries, soup kitchens, and [facility] [employer]. Patient refused resources, advised SW "You might as well take all of that stuff. I just need you to get me into the rescue mission." SW advised that the rescue mission is a first come first serve facility and that while this SW could secure him transportation I could not guarantee that he would get a bed today. Patient became verbally abusive with SW and advised that if he did not have a bed there tonight he would "just be right back in the ER" for a place to stay. SW commented that the patient seemed frustrated and unable to participate in a productive conversation. Again urged the patient participate in self-determination and encouraged him to follow up with the resources provided. SW apologized for the patient's frustrations but reminded him that this is the mission's policy and this SW cannot make the shelter accept him. SW called the rescue mission, spoke with their intake coordinator Liliane Channel who states that their facility is first come first serve and that the information provided by the patient was inaccurate.    Update:   Patient refusing to go to a shelter, asks for SW to arrange an uber to "[place]where in [employer] near [place]."     Bonita Quin, [place]

## 2021-07-19 NOTE — ED Notes (Signed)
Report to Lawson Fiscal, Charity fundraiser. Care transferred at this time.      Myrtie Neither Tomasa Rand, RN  07/19/21 [phone]

## 2021-07-19 NOTE — ED Notes (Signed)
I have reviewed discharge instructions with the patient.  The patient verbalized understanding.    Patient left ED via Discharge Method: ambulatory to Home with self via cab    Opportunity for questions and clarification provided.       Patient given 1 scripts.         To continue your aftercare when you leave the hospital, you may receive an automated call from our care team to check in on how you are doing.  This is a free service and part of our promise to provide the best care and service to meet your aftercare needs.??? If you have questions, or wish to unsubscribe from this service please call [phone].  Thank you for Choosing our [facility] Emergency Department.        Luellen Pucker, RN  07/19/21 1500

## 2021-07-19 NOTE — Discharge Instructions (Signed)
Follow-up with [facility] mental health.    Follow-up with local homeless shelter.    Return to ED if symptoms worsen or progress in any way.

## 2021-07-19 NOTE — ED Notes (Signed)
Constant Observer YEs   Constant Observer Oriented yes   High risk patients are in line of sight at all times Yes   Excess equipment/medical supplies not necessary for the care of the patient removed Yes   All sharp or dangerous objects are removed from room: including but not limited to belts, pens & pencils, needles, medications, cosmetics, lighters, matches, nail files, watches, necklaces, glass objects, razors, razor blades, knives, aerosol sprays, drawstring pants, shoes, cords (telephone, call bells, etc.) cleaning wipes or other cleaning items, aluminum cans, not permanently attached wall d??cor Yes   Telephone/cell phone removed as well as TV remote (batteries can be swallowed) Yes   Patient belongings removed and labeled at nurses station Yes   Excess linen is removed from room Yes   All plastic bags are removed from the room and replaced with paper trash bags Yes   Patient is in paper scrubs or appropriate gown and using hospital socks with rubber soles Yes   No metal, hard eating utensils or hard plates are on meal tray Yes   Remove all cleaning agents used by [employer] Yes   If Crucifix is hanging on a nail, remove Crucifix as well as the nail Yes       *If any question above is answered "No," documentation is required.       Luellen Pucker, RN  07/19/21 1311

## 2021-07-21 LAB — CULTURE, URINE: Culture: NO GROWTH

## 2021-07-22 LAB — C.TRACHOMATIS N.GONORRHOEAE DNA
Chlamydia trachomatis, NAA: NEGATIVE
N. Gonorrhoeae RNA: NEGATIVE

## 2021-10-13 ENCOUNTER — Inpatient Hospital Stay
Admit: 2021-10-13 | Discharge: 2021-10-13 | Disposition: A | Discharge status: Home / Self Care | Attending: Emergency Medicine

## 2021-10-13 DIAGNOSIS — F1490 Cocaine use, unspecified, uncomplicated: Secondary | ICD-10-CM

## 2021-10-13 LAB — URINALYSIS
BACTERIA, URINE: 0 /hpf
Bilirubin Urine: NEGATIVE
Blood, Urine: NEGATIVE
Glucose, UA: NEGATIVE mg/dL
Ketones, Urine: 80 mg/dL — AB
Nitrite, Urine: NEGATIVE
Protein, UA: 30 mg/dL — AB
Specific Gravity, UA: 1.032 — ABNORMAL HIGH (ref 1.001–1.023)
Urobilinogen, Urine: 1 EU/dL (ref 0.2–1.0)
pH, Urine: 5 (ref 5.0–9.0)

## 2021-10-13 LAB — DRUG SCREEN PSYCH, URINE
Amphetamine, Urine: POSITIVE — AB
Benzodiazepines, Urine: NEGATIVE
Cocaine, Urine: POSITIVE — AB
Opiates, Urine: NEGATIVE
THC, TH-Cannabinol, Urine: POSITIVE — AB

## 2021-10-13 MED ORDER — AZITHROMYCIN 250 MG PO TABS
250 MG | ORAL | Status: AC
Start: 2021-10-13 — End: 2021-10-13
  Administered 2021-10-13: 20:00:00 1000 mg via ORAL

## 2021-10-13 MED ORDER — DOXYCYCLINE HYCLATE 100 MG PO TABS
100 MG | ORAL_TABLET | Freq: Two times a day (BID) | ORAL | 0 refills | Status: AC
Start: 2021-10-13 — End: 2021-10-20

## 2021-10-13 MED ORDER — CEFTRIAXONE SODIUM 500 MG IJ SOLR
500 MG | INTRAMUSCULAR | Status: AC
Start: 2021-10-13 — End: 2021-10-13
  Administered 2021-10-13: 19:00:00 500 mg via INTRAMUSCULAR

## 2021-10-13 NOTE — ED Triage Notes (Signed)
Pt arrives via pov ambulatory after 2 unprotected sex, states 'ive been having fun". Pt denies discharge.

## 2021-10-13 NOTE — ED Notes (Deleted)
Pt left without being seen before triage. I never evaluated patient.      Margie Brink Steger Sheral Flow., MD  10/13/21 1321

## 2021-10-13 NOTE — ED Notes (Signed)
I have reviewed discharge instructions with the patient.  The patient verbalized understanding.    Patient left ED via Discharge Method: ambulatory to Home with (self).    Opportunity for questions and clarification provided.       Patient given 0 scripts.   No esign      To continue your aftercare when you leave the hospital, you may receive an automated call from our care team to check in on how you are doing.  This is a free service and part of our promise to provide the best care and service to meet your aftercare needs.??? If you have questions, or wish to unsubscribe from this service please call [phone].  Thank you for Choosing our [facility] Emergency Department.      Ross Marcus, RN  10/13/21 1504

## 2021-10-13 NOTE — Discharge Instructions (Addendum)
Schedule close follow-up with [facility] department for further STI testing.  Follow-up with free clinic.  Refrain from illicit substance use

## 2021-10-13 NOTE — ED Provider Notes (Signed)
Emergency Department Provider Note                   PCP:                PROVIDER Crissie FiguresUNKNOWN, MD               Age: 41 y.o.      Sex: male       ICD-10-CM    1. Screening for STD (sexually transmitted disease)  Z11.3           DISPOSITION Discharge - Pending Orders Complete 10/13/2021 01:32:15 PM        Medical Decision Making  41 year old male presents with complaint of STD check.  States that he has had numerous sexual encounters recently and does not use protection.  Patient denies dysuria, hematuria, penile ulcers or lesions, penile discharge, abdominal pain, nausea, vomiting, fever, chills.  Patient with no other symptoms at this time.    VSS.  Will administer Rocephin 500 mg IM and discharged home with doxycycline.  [facility].  Patient given strict return precautions.    Problems Addressed:  Screening for STD (sexually transmitted disease): acute illness or injury    Amount and/or Complexity of Data Reviewed  Labs: ordered.    Risk  Prescription drug management.      Complexity of Problem: 1 self limited or minor problem. (2)    I have conducted an independent ordering and review of Labs.    Considerations: Shared decision making was utilized in the care of this patient.   Social determinant affecting care: housing insecurity  Social determinant affecting care: substance abuse            Orders Placed This Encounter   Procedures    C.trachomatis N.gonorrhoeae DNA    Urinalysis w rflx microscopic    Drug Screen Psych, Urine        Medications   cefTRIAXone (ROCEPHIN) 500 mg in lidocaine 1 % 1.4286 mL IM Injection (500 mg IntraMUSCular Given 10/13/21 1338)       New Prescriptions    DOXYCYCLINE HYCLATE (VIBRA-TABS) 100 MG TABLET    Take 1 tablet by mouth 2 times daily for 7 days        Jamie ClementDaniel Casey is a 41 y.o. male who presents to the Emergency Department with chief complaint of STD check.    Chief Complaint   Patient  presents with    Exposure to STD      41 year old male with history of polysubstance abuse, cocaine use, methamphetamine use presents with complaint of "I have been sexually active recently and not been using protection and concern for possible STD exposure".  Denies dysuria, hematuria, flank pain, genital ulcers or lesions, nausea, vomiting, abdominal pain.  Patient denies any rash.  Patient without any other complaints at this time.  Denies chest pain, shortness of breath.  Patient homeless.  Denies SI, HI, AVH.  He also states that he would like a urine drug screen performed.     The history is provided by the patient. No language interpreter was used.       Review of Systems   Constitutional:  Negative for chills, fatigue and fever.   Respiratory:  Negative for cough and shortness of breath.    Cardiovascular:  Negative for chest pain.   Gastrointestinal:  Negative for abdominal pain, diarrhea, nausea and vomiting.   Genitourinary:  Negative for dysuria,  flank pain, genital sores, penile discharge, penile pain, penile swelling, scrotal swelling and testicular pain.   Musculoskeletal:  Negative for arthralgias and myalgias.   Skin:  Negative for rash.   Neurological:  Negative for weakness and headaches.   Psychiatric/Behavioral:  Negative for suicidal ideas.      Past Medical History:   Diagnosis Date    Hypertension         Past Surgical History:   Procedure Laterality Date    HEENT      jaw    ORTHOPEDIC SURGERY          No family history on file.     Social History     Socioeconomic History    Marital status: Single   Tobacco Use    Smoking status: Every Day     Packs/day: 0.25     Types: Cigarettes    Smokeless tobacco: Never   Substance and Sexual Activity    Alcohol use: Yes    Drug use: Yes     Types: Marijuana [name]         Patient has no known allergies.     Previous Medications    No medications on file        Vitals signs and nursing note reviewed.   Patient Vitals for the past 4 hrs:   Temp Pulse  Resp BP SpO2   10/13/21 1244 98.4 ??F (36.9 ??C) (!) 104 16 139/86 98 %          Physical Exam  Vitals and nursing note reviewed.   Constitutional:       Appearance: Normal appearance.   HENT:      Head: Normocephalic.      Nose: Nose normal.      Mouth/Throat:      Mouth: Mucous membranes are moist.      Comments: No oral lesions noted.  Eyes:      Extraocular Movements: Extraocular movements intact.      Pupils: Pupils are equal, round, and reactive to light.   Cardiovascular:      Rate and Rhythm: Normal rate.      Pulses: Normal pulses.   Pulmonary:      Effort: Pulmonary effort is normal.      Breath sounds: Normal breath sounds.   Abdominal:      General: Bowel sounds are normal.      Palpations: Abdomen is soft.      Tenderness: There is no abdominal tenderness.      Comments: Soft, nontender, nondistended.   Musculoskeletal:         General: No tenderness. Normal range of motion.   Skin:     General: Skin is warm.      Findings: No erythema or rash.      Comments: No rash present.   Neurological:      General: No focal deficit present.      Mental Status: He is alert and oriented to person, place, and time.      Cranial Nerves: No cranial nerve deficit.      Sensory: No sensory deficit.      Motor: No weakness.        Procedures    No results found for any visits on 10/13/21.     No orders to display                       Voice dictation software was used during the making of  this note.  This software is not perfect and grammatical and other typographical errors may be present.  This note has not been completely proofread for errors.     Holden Draughon Steger Sheral Flow., MD  10/13/21 1340

## 2021-10-15 LAB — C.TRACHOMATIS N.GONORRHOEAE DNA
Chlamydia trachomatis, NAA: NEGATIVE
N. Gonorrhoeae RNA: NEGATIVE

## 2021-10-29 ENCOUNTER — Inpatient Hospital Stay
Admit: 2021-10-29 | Discharge: 2021-10-30 | Disposition: A | Discharge status: Home / Self Care | Attending: Emergency Medicine

## 2021-10-29 DIAGNOSIS — F320 Major depressive disorder, single episode, mild: Secondary | ICD-10-CM

## 2021-10-29 LAB — COMPREHENSIVE METABOLIC PANEL
ALT: 31 U/L (ref 12–65)
AST: 49 U/L — ABNORMAL HIGH (ref 15–37)
Albumin/Globulin Ratio: 1.3 (ref 0.4–1.6)
Albumin: 4.1 g/dL (ref 3.5–5.0)
Alk Phosphatase: 67 U/L (ref 50–136)
Anion Gap: 5 mmol/L (ref 2–11)
BUN: 22 MG/DL (ref 6–23)
CO2: 27 mmol/L (ref 21–32)
Calcium: 9.2 MG/DL (ref 8.3–10.4)
Chloride: 107 mmol/L (ref 101–110)
Creatinine: 1.3 MG/DL (ref 0.8–1.5)
Est, Glom Filt Rate: >60 mL/min/{1.73_m2} (ref >60)
Globulin: 3.2 g/dL (ref 2.8–4.5)
Glucose: 145 mg/dL — ABNORMAL HIGH (ref 65–100)
Potassium: 3.9 mmol/L (ref 3.5–5.1)
Sodium: 139 mmol/L (ref 133–143)
Total Bilirubin: 0.4 MG/DL (ref 0.2–1.1)
Total Protein: 7.3 g/dL (ref 6.3–8.2)

## 2021-10-29 LAB — CBC WITH AUTO DIFFERENTIAL
Absolute Immature Granulocyte: 0 10*3/uL (ref 0.0–0.5)
Basophils %: 2 % (ref 0.0–2.0)
Basophils Absolute: 0.1 10*3/uL (ref 0.0–0.2)
Eosinophils %: 3 % (ref 0.5–7.8)
Eosinophils Absolute: 0.2 10*3/uL (ref 0.0–0.8)
Hematocrit: 41.5 % (ref 41.1–50.3)
Hemoglobin: 14.1 g/dL (ref 13.6–17.2)
Immature Granulocytes: 0 % (ref 0.0–5.0)
Lymphocytes %: 34 % (ref 13–44)
Lymphocytes Absolute: 1.5 10*3/uL (ref 0.5–4.6)
MCH: 32.4 PG (ref 26.1–32.9)
MCHC: 34 g/dL (ref 31.4–35.0)
MCV: 95.4 FL (ref 82–102)
MPV: 9.5 FL (ref 9.4–12.3)
Monocytes %: 7 % (ref 4.0–12.0)
Monocytes Absolute: 0.3 10*3/uL (ref 0.1–1.3)
Neutrophils %: 54 % (ref 43–78)
Neutrophils Absolute: 2.4 10*3/uL (ref 1.7–8.2)
Platelets: 265 10*3/uL (ref 150–450)
RBC: 4.35 M/uL (ref 4.23–5.6)
RDW: 13.3 % (ref 11.9–14.6)
WBC: 4.5 10*3/uL (ref 4.3–11.1)
nRBC: 0 10*3/uL (ref 0.0–0.2)

## 2021-10-29 LAB — ACETAMINOPHEN LEVEL: Acetaminophen Level: <2 ug/mL — ABNORMAL LOW (ref 10.0–30.0)

## 2021-10-29 LAB — SALICYLATE LEVEL: Salicylate, Serum: 3.4 MG/DL (ref 2.8–20.0)

## 2021-10-29 MED ORDER — OLANZAPINE 5 MG PO TABS
5 MG | Freq: Every evening | ORAL | Status: DC
Start: 2021-10-29 — End: 2021-10-30
  Administered 2021-10-30: 02:00:00 5 mg via ORAL

## 2021-10-29 NOTE — ED Provider Notes (Signed)
Patient states he is stable does not have any self or other harming thoughts and mentally is clear now.  He has been interviewed at length by Gae Dry, nurse practitioner.  Interviewed him he states that he feels comfortable and safe to go home.  He is not on official papers.  Patient is aware that should anything change or any worsening or return of thoughts of concern he should return at any point.     Hal Morales, MD  10/31/21 [phone]

## 2021-10-29 NOTE — ED Notes (Signed)
Report given to joel, rn to resume care at this time     Elior Robinette, RN  10/29/21 1918

## 2021-10-29 NOTE — ED Notes (Signed)
Pt resting in bed with even and unlabored respirations. Pt denies any needs.     Ross Marcus, RN  10/29/21 1737

## 2021-10-29 NOTE — ED Notes (Signed)
Pt resting in bed with even and unlabored respirations. Eyes closed.     Qiana Landgrebe, RN  10/29/21 1737

## 2021-10-29 NOTE — ED Notes (Signed)
Pt resting in bed with even and unlabored respirations. Eyes closed.     Henrietta Cieslewicz, RN  10/29/21 1737

## 2021-10-29 NOTE — ED Notes (Signed)
Pt resting in bed with even and unlabored respirations. Eyes closed. Sitter at bedside.     Ross Marcus, RN  10/29/21 [phone]

## 2021-10-29 NOTE — ED Notes (Signed)
Pt resting in bed with even and unlabored respirations. Eyes closed.      Ross Marcus, RN  10/29/21 1108

## 2021-10-29 NOTE — ED Notes (Signed)
Received handoff from Dr. Francoise Schaumann.  Case management consulted.  Patient later requesting to speak with psychiatrist.  [facility] psychiatrist NP.  Recommends no indication for placement of IVC paperwork.  Recommends taking Zyprexa 5 mg at night.  Concerned that patient remains intoxicated from recent methamphetamine use.  States will reassess in the morning.     Krystiana Fornes Steger Sheral Flow., MD  10/29/21 2029

## 2021-10-29 NOTE — ED Provider Notes (Signed)
Emergency Department Provider Note                   PCP:                PROVIDER Crissie Figures, MD               Age: 41 y.o.      Sex: male       ICD-10-CM    1. Current mild episode of major depressive disorder, unspecified whether recurrent (HCC)  F32.0           DISPOSITION          Medical Decision Making   41 year old male presents emerged department via private vehicle with a chief complaint of severe depression.  Patient states that he has some suicidal thoughts but has no active plan to hurt himself he feels as if he would not actually hurt himself but is requesting help.  He is normally not from the area he states that he calls [facility] but he has been here in [place] for quite some time he has been on 3 different depression medicines in the past but is not on any current medicines he states he has a place to stay and he works at [employer] so has insurance.  Vital signs are reviewed.  We will hold the patient here in the emergency department and obtain basic lab work and see if case management can help assist him with getting into a voluntary psych facility.    Amount and/or Complexity of Data Reviewed  Labs: ordered.       Complexity of Problem:1 acute or chronic illness that poses a threat to life or bodily function. (5)  The patients assessment required an independent historian: none  I have conducted an independent ordering and review of Labs.  I have reviewed records from an external source: ED records from outside this hospital.  I have reviewed records from an external source: provider visit notes from PCP.  I have reviewed records from an external source: provider visit notes from outside specialist.  Considerations: Shared decision making was utilized in the care of this patient.   Social determinant affecting care: lack of transportation  Evidence based risk calculation performed: none  I discussed case with a Child psychotherapist.  I discussed case with patient           Orders  Placed This Encounter   Procedures    CBC with Auto Differential    CMP    Urine Drug Screen    Salicylate    Acetaminophen Level        Medications - No data to display    New Prescriptions    No medications on file        Jamie Casey is a 41 y.o. male who presents to the Emergency Department with chief complaint of    Chief Complaint   Patient presents with    Mental Health Problem      41 year old male presents emerged department via private vehicle with a chief complaint of severe depression.  Patient states that he has some suicidal thoughts but has no active plan to hurt himself he feels as if he would not actually hurt himself but is requesting help.  He is normally not from the area he states that he calls [facility] but he has been here in [place] for quite some time he has been on 3 different depression medicines in the  past but is not on any current medicines he states he has a place to stay and he works at [employer] so has insurance.          Review of Systems   Constitutional:  Negative for chills, fatigue and fever.   HENT:  Negative for congestion, dental problem and drooling.    Eyes:  Negative for discharge.   Respiratory:  Negative for cough, chest tightness and shortness of breath.    Cardiovascular:  Negative for chest pain and palpitations.   Gastrointestinal:  Negative for abdominal pain, diarrhea, nausea and vomiting.   Endocrine: Negative for polydipsia.   Genitourinary:  Negative for difficulty urinating, dysuria and hematuria.   Musculoskeletal:  Negative for back pain.   Skin:  Negative for rash and wound.   Neurological:  Negative for dizziness, seizures, speech difficulty, light-headedness and headaches.   Psychiatric/Behavioral:  Positive for suicidal ideas. Negative for agitation, hallucinations and self-injury.         Positive depression     Past Medical History:   Diagnosis Date    Hypertension         Past Surgical History:   Procedure Laterality Date    HEENT       jaw    ORTHOPEDIC SURGERY          No family history on file.     Social History     Socioeconomic History    Marital status: Single   Tobacco Use    Smoking status: Every Day     Packs/day: 0.25     Types: Cigarettes    Smokeless tobacco: Never   Substance and Sexual Activity    Alcohol use: Yes    Drug use: Yes     Types: Marijuana [name]         Patient has no known allergies.     Previous Medications    No medications on file        Vitals signs and nursing note reviewed.   Patient Vitals for the past 4 hrs:   Temp Pulse Resp BP SpO2   10/29/21 0420 98 ??F (36.7 ??C) 95 16 122/69 97 %          Physical Exam  Vitals and nursing note reviewed.   Constitutional:       Appearance: Normal appearance.   HENT:      Head: Normocephalic and atraumatic.      Right Ear: Tympanic membrane normal.      Nose: Nose normal.      Mouth/Throat:      Mouth: Mucous membranes are moist.   Eyes:      Extraocular Movements: Extraocular movements intact.      Pupils: Pupils are equal, round, and reactive to light.   Cardiovascular:      Rate and Rhythm: Normal rate and regular rhythm.      Heart sounds: No murmur heard.  Pulmonary:      Effort: Pulmonary effort is normal. No respiratory distress.      Breath sounds: Normal breath sounds.   Abdominal:      General: There is no distension.      Palpations: Abdomen is soft.      Tenderness: There is no abdominal tenderness. There is no guarding.   Musculoskeletal:         General: No swelling or tenderness. Normal range of motion.      Cervical back: Normal range of motion and neck supple. No rigidity  or tenderness.   Skin:     General: Skin is warm and dry.      Capillary Refill: Capillary refill takes less than 2 seconds.   Neurological:      General: No focal deficit present.      Mental Status: He is alert and oriented to person, place, and time. Mental status is at baseline.   Psychiatric:      Comments: Flat affect        Procedures    No results found for any visits on 10/29/21.      No orders to display                       Voice dictation software was used during the making of this note.  This software is not perfect and grammatical and other typographical errors may be present.  This note has not been completely proofread for errors.     Glendell Docker, DO  10/30/21 2159

## 2021-10-29 NOTE — ED Notes (Signed)
Pt resting in bed with even and unlabored respirations. Eyes closed. Sitter at bedside.     Gail Creekmore, RN  10/29/21 0842

## 2021-10-29 NOTE — Care Coordination-Inpatient (Signed)
10/29/21 1305   Service Assessment   Patient Orientation Alert and Oriented   Cognition Alert   History Provided By Patient   Primary [place] None   Patient's Healthcare Decision Maker is: Legal Next of Kin   PCP Verified by CM No   Prior Functional Level Independent in ADLs/IADLs   Current Functional Level Independent in ADLs/IADLs   Can patient return to prior living arrangement Unknown at present   Ability to make needs known: Good   Family able to assist with home care needs: No   Would you like for me to discuss the discharge plan with any other family members/significant others, and if so, who? No   Community Resources Psychiatric Treatment   CM/SW Referral Safety/Abuse;Psychiatry   Social/Functional History   Type of Home Homeless   Discharge Planning   Potential Assistance Purchasing Medications No   Patient expects to be discharged to: Behavioral Health/Substance/Detox   Services At/After Discharge   Transition of Care Consult (CM Consult) Discharge Planning   Mode of Transport at Discharge Self   Confirm Follow Up Transport Self   Condition of Participation: Discharge Planning   The Plan for Transition of Care is related to the following treatment goals: TBD   The Patient and/or Patient Representative was provided with a Choice of Provider? Patient   The Patient and/Or Patient Representative agree with the Discharge Plan? Yes   Freedom of Choice list was provided with basic dialogue that supports the patient's individualized plan of care/goals, treatment preferences, and shares the quality data associated with the providers?  Yes     Cm met with patient at bedside, patient reluctant to answer questions, requesting mental health assistance. Patient declined resources, declined referral to [facility]. Patient agitated throughout conversation stating the provider had promised him IP tx. Patient informed we do not have an IP tx center at [facility]. MD and RN updated. Psych eval pending.      Additional chart found - MRN [id number]  See CM note from previous ER admission for similar complaints/        Date of Service:  07/19/2021  2:27 PM  SW met with patient, provided verbal and written information regarding homeless shelters, residential addictions recovery, [facility], food pantries, [employer], and [facility] [employer]. Patient refused resources, advised SW "You might as well take all of that stuff. I just need you to get me into the rescue mission." SW advised that the rescue mission is a first come first serve facility and that while this SW could secure him transportation I could not guarantee that he would get a bed today. Patient became verbally abusive with SW and advised that if he did not have a bed there tonight he would "just be right back in the ER" for a place to stay. SW commented that the patient seemed frustrated and unable to participate in a productive conversation. Again urged the patient participate in self-determination and encouraged him to follow up with the resources provided. SW apologized for the patient's frustrations but reminded him that this is the mission's policy and this SW cannot make the shelter accept him. SW called the rescue mission, spoke with their intake coordinator Liliane Channel who states that their facility is first come first serve and that the information provided by the patient was inaccurate.     Update:   Patient refusing to go to a shelter, asks for SW to arrange an uber to "[place] in  [place]

## 2021-10-29 NOTE — ED Notes (Signed)
Pt resting in bed with even and unlabored respirations. Pt denies any needs. Breakfast was given to patient.     Ross Marcus, RN  10/29/21 1736

## 2021-10-29 NOTE — ED Triage Notes (Signed)
Patient presents ambulatory to triage in no apparent medical distress, but significant emotional distress. Patient sts "I have been going through a lot and I think I just need to be back on my medicine" Pt sts he has been on multiple different medications throughout the years but nothing has helped very much. Pt sts "I just don't like the high feeling that the meds have given me. I want something without the high feeling that will help with my depression". Pt sts "I just need to be away from people and to read my bible". Patient denies active SI/HI at this time, sts "I just don't like feeling like this".    Patient seen and assessed by Dr Francoise Schaumann during triage. Pt deemed low risk at this time and should be allowed to keep belongings.

## 2021-10-29 NOTE — ED Notes (Signed)
Pt resting in bed with even and unlabored respirations. Eyes closed.      Derisha Funderburke, RN  10/29/21 1108

## 2021-10-29 NOTE — ED Notes (Signed)
Pt resting in bed with even and unlabored respirations. Eyes closed.      Lanny Lipkin, RN  10/29/21 1108

## 2021-10-29 NOTE — ED Notes (Signed)
Constant Observer Yes - Name: Jodi Marble Observer Oriented yes   High risk patients are in line of sight at all times Yes   Excess equipment/medical supplies not necessary for the care of the patient removed Yes   All sharp or dangerous objects are removed from room: including but not limited to belts, pens & pencils, needles, medications, cosmetics, lighters, matches, nail files, watches, necklaces, glass objects, razors, razor blades, knives, aerosol sprays, drawstring pants, shoes, cords (telephone, call bells, etc.) cleaning wipes or other cleaning items, aluminum cans, not permanently attached wall d??cor Yes   Telephone/cell phone removed as well as TV remote (batteries can be swallowed) Yes   Patient belongings removed and labeled at nurses station Yes   Excess linen is removed from room Yes   All plastic bags are removed from the room and replaced with paper trash bags Yes   Patient is in paper scrubs or appropriate gown and using hospital socks with rubber soles Yes   No metal, hard eating utensils or hard plates are on meal tray Yes   Remove all cleaning agents used by [employer] Yes   If Crucifix is hanging on a nail, remove Crucifix as well as the nail Yes       *If any question above is answered "No," documentation is required.       Gloriann Loan, RN  10/29/21 [phone]

## 2021-10-29 NOTE — ED Notes (Signed)
Pt resting in bed with even and unlabored respirations. Eyes closed.     Ross Marcus, RN  10/29/21 1737

## 2021-10-29 NOTE — ED Notes (Signed)
Pt resting in bed with even and unlabored respirations. Pt denies any needs.     Jathniel Smeltzer, RN  10/29/21 1737

## 2021-10-29 NOTE — Behavioral Health Treatment Team (Signed)
PSYCHIATRIC EVALUATION    Date of Service: 10/29/2021    Purpose:  Psychiatric Diagnostic Evaluation  Referral Source: Melanee Spry MD  History  From: patient and patient chart      Chief Complaint:  SI    ---------------------------------------------------------------------------------------------------------------------------------------------------------------------------------------------------------------------------------------------------------------------------------------------------------------------------------------------------------------------------------------------    10-29-2020 - PMHNP note -   1:30 pm - To room to speak with pt.  Pt  with eyes closed - attempted to start conversation with pt - pt refusing to answer questions at this time, stating he is "frustrated" - asked for me to return later and then would not respond to any other questions - MD aware.      3:15 pm - To room to speak with pt.  Pt in bed with eyes closed.  He will not open eyes and very limited on answering any questions at this time - continues to state he does not feel like talking right now.  He makes a few statements about "prayer" and "getting his mind right" / when asked about SI - pt states "yeah but I just need to pray and think about some things"  / pt will not tell me if he has had or is having any AVH - again making bizarre statements about his mind  / pt will not tell me about any recent drug use (noting on 10-13-2021 pt was positive for cocaine, amphetamine and THC - today, UDS still pending).  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    Noting in chart review, pts aunt endorsed pt having long hx of mental and drug problems / hx of  possible childhood traumas,with SI/HI / hx of anxiety, depression, bipolar, PTSD and ?paranoid schizophrenia.    --------------------------------------------------------------------------------------------------------------------------------------------------------------------------------------------------------------------------------------------------------------------------------------------------------------------------------------------------------------------------------------------    History of Present Illness:  Jamie Casey is a 41 y.o. male with a history significant for cocaine intoxication with perceptual disturbance, methamphetamine intoxication, paranoia, depression unspecified, cocaine use, depression, anxiety, bipolar, PTSD, ?paranoid schizophrenia  Pt presented to the ED today, c/o:  Triage note - Patient presents ambulatory to triage in no apparent medical distress, but significant emotional distress. Patient sts "I have been going through a lot and I think I just need to be back on my medicine" Pt sts he has been on multiple different medications throughout the years but nothing has helped very much. Pt sts "I just don't like the high feeling that the meds have given me. I want something without the high feeling that will help with my depression". Pt sts "I just need to be away from people and to read my bible". Patient denies active SI/HI at this time, sts "I just don't like feeling like this".     Patient seen and assessed by Dr Randon Goldsmith during triage. Pt deemed low risk at this time and should be allowed to keep belongings.     MD note - 41 year old male presents emerged department via private vehicle with a chief complaint of severe depression.  Patient states that he has some suicidal thoughts but has no active plan to hurt himself he feels as if he would not actually hurt himself but is requesting help.  He is normally not from the area he states that he calls [facility]  but he has  been here in [place] for quite some time he has been on 3 different depression medicines in the past but is not on any current medicines he states he has a place to stay and he works at [employer] so has insurance.     41 year old male presents emerged department via private vehicle with a chief complaint of severe depression.  Patient states that he has some suicidal thoughts but has no active plan to hurt himself he feels as if he would not actually hurt himself but is requesting help.  He is normally not from the area he states that he calls [facility] but he has been here in [place] for quite some time he has been on 3 different depression medicines in the past but is not on any current medicines he states he has a place to stay and he works at [employer] so has insurance.  Vital signs are reviewed.  We will hold the patient here in the emergency department and obtain basic lab work and see if case management can help assist him with getting into a voluntary psych facility.    ---------------------------------------------------------------------------------------------------------------------------------------------------------------------------------------------------------------------------------------------------------------------------------------------------------------------------------------------------------------------------------------------    Chart Review: (pt noted to have two charts - other information obtained from MRN:  [id number])    11-12-2020 - [facility] - HPI: Jamie Casey is a 41 y.o. male who presents to the Emergency Department with paranoia. The patient states he was in good health until this evening when he did some cocaine (roughly 3 hours ago). He believes the cocaine was cut with methamphetamine. Ever since that time, he has had increasing thoughts that people are following him and trying to hurt him. Because he has been followed, he also is reminded of his  PTSD and has had some thoughts of self-harm. He called EMS for assistance. He asked EMS to bring him to [facility] even though he was closer to [facility] because he was worried about the people following him.     Initial MDM and ED Course  41 year old male presents for paranoia in the setting of cocaine and possible methamphetamine intoxication. Because of the patient's symptoms, I have placed him on inability to consent and ordered a suicide watch. I will obtain basic labs along with an alcohol level and drug screen. I will provide him Ativan to help with his paranoia. I will plan to observe him here for clinical sobriety, potentially seeking social work assistance for persistent symptoms.    5:46 AM  Unfortunately, the patient became more paranoid and more agitated over a period of observation. He admitted to his sitter that when he first arrived and use the restroom, he did his remaining cocaine in the bathroom as he did not want it confiscated. Despite attempts at verbal de-escalation and redirection by myself, his sitter, multiple nurses, and the police officers, the patient symptoms worsened. He became increasingly aggressive and required physical and chemical restraint.    Pt currently on ITC.     2:00pm Spoke with friend Leda Roys who reports that she is his minister from [place], [place]. PT recently moved from [facility] to [facility] and told Shirlean Mylar that he moved in with an aunt that lives in [place]. Per Shirlean Mylar, pt has hx of drug addiction and mental health problems. Shirlean Mylar reports she is not sure what pt's exact diagnosis is but reports he has taken medication in the past but is generally not compliant with medications. She reports that she believes pt does have  a job in [place] but is not sure where it is. She reports that she is main contact as pt's brother and father are estranged after having to "deal with all his drug issues for many years."    2:15pm Attempted to speak with pt more. He is still  sleeping on and off but did report he feels physically well but feels "weird" mentally.      Major Life Areas: Decrease/Increase in Energy- Pt presented to ED via EMS with thoughts that he was being stalked by unspecified persons. Pt's UDS positive for amphetamines, cocaine, and cannabinoids. Pt lethargic and sleeping when SW attempted to speak with him. Pt reports that he's here due to "some bullshit." Pt did not respond to further questions from SW. Called Leda Roys at [phone] who was listed on pt's facesheet as next of kin. She reports she will call SW back with numbers of pt's brother in [place].     4:30pm SW consulted again by ED MD Braxton. Pt now reporting to MD that he is suicidal with a plan to cut his wrists. Spoke with pt who reiterates SI. Pt reports originally from the [place], [place] area but moved to [place] a few months ago. Pt reports he currently lives with his aunt in [place]. Pt reports he cannot remember her contact number. Pt reports he has been hospitalized in inpatient psychiatric facility in the past in the last 5 years. Pt reports he has had dx of depression, anxiety, bipolar. Pt reports he remembers being prescribed zyprexa in the past when hospitalized but never kept taking medications after hospitalizations. Pt reports that he has no current outpatient mental health or substance abuse tx. Pt reports he is able to return to his aunt's home at discharge. Pt reports taking meth and cocaine yesterday. Pt able to contract for safety while in the hospital.    5:00pm Reviewed with ED MD Braxton. Pt placed on commitment papers--SW completed Part 1 and Dr. Reva Bores completed Part 2. Plan is to refer to inpatient psych facilities. Covid test pending. Telepsych ordered. SW reached out to ED Psychiatrist in [place] at [phone].    5:30pm Covid is negative.     Pt was alert and orientedx4. Pt reported that he was brought to the ED at his request after he had been "showing the love" to  people giving out "money, beer, and drugs" to the people who live in "the district" around [place]. Pt reported that the same day he had had a phone conversation with his cousin who he calls his sister who was intoxicated and berating him for decisions that he had made in the past. Pt reported that he "didn't care about anything" after that conversation and that is when he used cocaine because he was so upset. Pt reports that it must have been laced with methamphetamine. Pt reports that prior to using cocaine the day he came to the ED, he hadn't used cocaine in 6 months.     Pt denied SI, HI and visual hallucinations to LMSW. When pt was asked about auditory hallucinations pt reported "that is just my conscious and I am spiritual so I hear things but it doesn't mean anything." Pt would not elaborate on the voices he hears or their statements. Pt had to be redirected multiple times due to his tangentiality. Pt reported that he is currently being "hunted." Pt reports that he doesn't feel safe in the ED and is worried that we are trying to drug him. When LMSW  asked the pt who is "hunting" him, the pt reported "I don't know. I don't know anyone here. I haven't been here long enough to even know anyone. How would I know?" Pt reported that people must be following him after he was "showing the love to the community" and they saw that he had money.     Pt reported that he just wanted to leave the ED and "disappear, delete all social media, and leave the state." Pt reported that he does not want to end his life because he has 6 kids to care for and that they need him but that he truly wants to get away from the [place] because he is being "hunted."    Pt reported that he thinks he is getting too much sleep because his nightmares are back and that only happens when he is overly rested. Pt also reports that he hasn't been eating his meals because he is not certain that hospital staff/ others have not put medication  in them.     Pt reported that the only reason he told an officer that he was going to hurt himself is because he didn't feel safe where he was and knew that the officers wouldn't take him to the hospital unless he said that. Pt reports that he wanted to come to the hospital for safety but doesn't even feel like he is safe here (at the hospital).      Pt was alert and orientedx4. Pt reports that he is doing "fabulous" and has received all the mental health treatment that he has needed since being in the ER. Pt reported that being in the same room in the ED is "not helpful" and that he he is "good to go" and will follow up with OP psychiatry and counseling if discharged. Pt reported that he is not suicidal, homicidal, having AV hallucinations, nor having thoughts that he is being "stalked or hunted." Pt laughed and said "no" when I asked him if he was having any thoughts of being stalked or hunted.     LMSW was granted permission from the pt to gather collateral information from his aunt, Vonna Kotyk [phone]. Pt's aunt reports that the pt came to live with her at Christmas (2021). Pt's aunt reports that before that the pt was living in a tent in [place], [place] and she didn't want him to keep living in a tent when she had a bed to offer the pt. Pt's aunt reports that the pt has a lot of "childhood issues" as well as mental and substance misuse issues. Pt's aunt reports that the pt does smoke marijuana regularly but that she wasn't aware of any other substance misuse until this past weekend. Pt's aunt reports that the pt went to the aunt's daughter's house and began threatening to kill himself and "everyone in the family" on Saturday afternoon one week ago. Pt's aunt reports that the pt did tell her that he used "coke" on Sunday and Monday night but did not report that he used cocaine prior to threatening to kill himself and the family on Saturday. Pt's aunt reports that she doesn't feel comfortable with the pt  discharging to home today because the pt is "coming unhinged" and worries about him hurting himself or someone else due to his anger, mental health, and substance issues. Pt's aunt reports that the pt is manipulative and will say what he needs to say in order to be able to go home. Pt's aunt  reports that the pt called her yesterday reporting that the hospital staff were "jumping him" and "forcing medications in his mouth" and "stabbing him with needles" against his will while the sitter was laughing in the background. Pt's aunt reported that she doesn't believe that this happened but attributes it to his current mental state. Pt's aunt reports that the pt had a "crying episode" one month ago where he was crying over his issues from childhood and had to be held like a child to be consoled.     Subjective:    "I am good to go. I am loving life. Just being here was all of the mental health treatment I needed."     "I just want to leave here and love on people."    "I spoke to my aunt already today and she said that she feels okay with me coming home but she wants to speak with you (SW) too confirm what I am saying. "     Per Psychiatry, Elisabeth Cara, PA, and pt's ED Attending, Dr Riccardo Dubin, the pt is being discharged to follow up with OP services. LMSW provided the pt the full [facility] resource list, the brochure for [place], and a brochure for the free clinics in [place]. LMSW is unable to make the pt a follow up appt at [facility] due to it being a weekend. Pt reports that he will call himself to make a follow up appt. Pt also declined to call [facility] at this time and connect with a recovery coach.    Pt called his aunt and his aunt is unwilling to pick up the pt and transport him home. Pt does not have another way home. LMSW has called [employer] (under account) to transport the pt to his home address ([place], [place]). Pt has no other  needs at this time.     12-20-2020 - [facility] - Linn Goetze is a 40 y.o. male with no past medical history. He presents today from jail for nausea and diaphoresis. Per police, shortly after being incarcerated patient became nauseous and vomited multiple times. Upon evaluation patient was diaphoretic and tremulous. Upon arrival to ED, he endorses "not feeling right" and has diffuse tremors. States that he is not from this area and bought marijuana this evening which he smoked. Denies any other drug use. Denies chest pain, abdominal pain, shortness of breath. Otherwise denies any other signs/symptoms of illness, associated symptoms, acute changes from baseline, or other concerns at this time.    This is a 41 y.o. male presenting with nausea and anxiety after smoking marijuana this evening. On arrival, patient is tremulous and obviously distraught. Routine blood work obtained and overall reassuring. 1L IVF postoperative patient and Ativan ordered. UDS positive for cannabinoids and amphetamines. Patient denies any other drug use this evening, amphetamines likely the cause for patient's presentation. Patient remained vitally stable and HR improved. He was given strict precautions for returning to the ED at discharge.    SW arranged transport with [employer] for pt to be transported to [address] [place] [address]    01-19-2021 - ED - Jamie Casey is a 41 y.o. male who returns after a recent discharge this evening after receiving azithromycin and ceftriaxone empirically for STDs. He states he went outside and smoked 2 cigarettes and he thinks that that triggered his vomiting. He has had a couple of mucousy stools since then. He states that  he has not eaten anything in 2 days because he has been doing a lot of cocaine and drinking alcohol for the past 2 to 3 days. He has been smoking and snorting the cocaine but is never used IV drugs. He has a history of depression and suicide attempt but has not been  feeling suicidal lately and currently denies suicidal ideation, homicidal ideation, abnormal perceptions, but does endorse a depressed mood. He denies fever, chills, cough, shortness of breath. He does endorse marijuana use but none today.    01-19-2021 - [facility] - Pt found by EMS at a QT outside drinking a beer. States the pt called EMS himself because he was having SI thoughts, with no specific plan. 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  He denies any auditory or visual hallucinations.  He denies any known family history of psychiatric illnesses.  No personal history of psychiatric illness, but admits to having some anxiety and depression.  He uses marijuana, but denies any other drug use.  Denies any daily alcohol use, but drank some tonight.  Patient states he came here to "just relax and chill, I didn't even want to come here."  Denies any active suicidal thoughts and states "you do not think about suicide, you just do it."  No plan.  No prior history of suicide attempts.    Not cooperative with questioning.  States he wants to sleep.  Discussed we would have a psychiatrist speak to him to address his mental health.  He states he does not want to speak to a psychiatrist.  Will discharge to follow up at [facility].    01-20-2021 - [facility] - Jamie Casey is a 41 y.o. male who presents to the ED with a chief complaint of dehydration.  Patient states that he has been very weak and feels like he is getting dehydrated.  He was walking a lot today and not hydrating as much as usual.  He feels like something could be wrong given his degree of dehydration.  His lips are chapped as well.  Also complains of some knee pain bilaterally but worse in the right leg.  States he has some arthritis.  Does admit to smoking  marijuana today but denies any alcohol use.  Denies any suicidal thoughts.    01-21-2021 [facility] - Pt states he "has been going through some tough stuff lately and feels depressed". He would like to be evaluated for depression. No physical complaints at this time. AAOx4. No SI/HI.    Presents with Complains of homelessness. Patient states that he has no place to live, moved from [place] a couple months ago and has stayed in [place]. He states he was seen at [facility] and they referred him to rescue mission but when he got there they did not know about him and he came here instead. He states that when he lived in [place] he had better resources than here. He denies any suicidal homicidal ideation. He says he feels anxious about his life and words taking him. He states if he had a place to stay he would feel much more comfortable. He states he has not been able to shower in a couple days. Denies any fevers chills nausea vomiting or dizziness.    Pt will  d/c to [facility] homeless shelter as he Is unable to return back to his family home. Taxi services scheduled for a 11am pick up, bedside RN notified. Pt provided with affordable housing and community resources. Electronic medication voucher sent to [facility] to assist with d/c medication coverage. Pt verbalized understanding of DCP.     06-12-2021 - [facility] - Pt to ED via [facility] for withdrawal from cocaine that he last used a few hours ago and meth that he used x2 days ago. Pt states ETOH use today as well. Pt states "I need to do this so I can get into this program." Pt denies pain at this time. Pt states his withdrawal symptom is depression. Pt is mildly cooperative at this time, will not make eye contact and reluctant to give full answers.    41 year old male presents to the emerged department via EMS with chief complaint of cocaine and methamphetamine use.  Patient states that he is wanting help on getting into an  addiction center.  He states that he is wanting to quit taking these medications is going to ruin his life.  Patient states that he is having suicidal ideations as well as hallucinations.  He does not have any current plan to hurt himself    Diagnosis management comments: 41 year old male presents for wanting resources to help with drug problems.  Patient states that he uses cocaine and methamphetamines.  He admits to using cocaine earlier this morning.  Patient states that he is currently suicidal as well as having auditory hallucinations.  However he does not have a current plan.  This point basic BMS orders were initiated I will not place the patient on an involuntary hold.  I do think the patient should be assessed by case management in the morning to see if they can help with finding resources for placement for drug addictions.    Patient resting in bed in no acute distress.  Patient states that he is homeless.  Denies SI, HI, AVH, paranoid delusions.  Case management consulted and meeting with patient.  Patient denies wanting to hurt himself or others.  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 [facility] consulted. Case management to arrange follow-up/support.     CM met with patient at bedside. Patient states he has been trying to get clean. Reports recent drug and alcohol use. Denies SI HI.   Offered assistance with [facility] and sober living options.   Spoke with pt about current struggles with addiction and the ill effects it has on one's health/wellbeing and how it effects those closest to them. I have provided pt with a "addiction packet" . I have put together a packet that is compiled with information about the following :[facility], [facility], [facility], [place], [facility], [place] and [place]. Opportunities for questions/concerns were given.   Patient given cell phone to  speak with overcomers.      Patient left with out speaking to [facility]. No further CM needs.    07-17-2021 - [facility] - Pt arrives via EMS c/o of back pain. Pt is homeless and was walking when EMS was called for chest pain but pt denies chest pain. PT wished to ride to [facility] because that is where his people are.  Pt was unable to be found in the lobby for several hours and reappeared in the lobby  stating he was suicidal and going to jump in front of a train.   MD note - 41 year old black male with history of depression on no medications and homeless presents to the emergency department complaining of increasing depression over the last few days with suicidal ideation.  When asked if he was hearing voices he said we all hear voices.  When asked if any of the voices talking to him or threatening or telling him to do anything he declined to answer.  As soon as it was back in the room he was requesting food from the nurse and asking how long he would be able to stay.  Patient does not voice any specific reason why he has become more depressed or suicidal.  Telepsych note - Admit to inpatient psychiatry service / Ativan 2 mg q 8 hrs prn / Haldol 5 mg q 8 hrs prn / Abilify 5 mg daily / Trazodone 50 mg bedtime prn  MD note - Diagnosis management comments: Patient evaluated by psychiatry, felt to be a danger to himself and white papers were completed.  Case to be referred to case management in the a.m.    CM note - Patient chart reviewed by Education officer, museum. Patient placed on involuntary commitment last night by psychiatry and ER MD. Recommendation for re-consult in 24 hours. Patient is currently homeless and not insured. [facility] does not open until 12pm for referrals. SW will reach out then to place the patient on their wait list.    SW called [facility] admissions line, phone rang with no answer. Voicemail not available. SW called again, spoke with Fraser Din who advised she would need to call SW back. Referral  packet prepared with the patient's records as well as a "EMTALA" and COVID screening. This along with ER note, psych report, and IVC papers with vitals and MAR faxed to [facility], [facility], [facility], and [facility]. Awaiting call back from [facility].     MD note - Assumed care of patient at 7:00 AM.  41 yo AAM w/ hx of homelessness presents with worsening depression and reported SI. Psychiatry evaluated patient. Patient placed on IVC papers on 07/17/21.     This morning, patient is resting in no acute distress.  Patient denies SI, HI, AVH.  Patient states that he would like to speak with a Education officer, museum at this time.  Patient was noted to have moderate LE and 50-100 WBCs on UA.  Patient was started on doxycycline.  Urine GC/chlamydia was ordered.  Will also administer Rocephin IM.  Awaiting facility placement at this time.  Will discuss plans with case management. VSS. RRR. Lungs CTAB. Abdomen soft, NTND. No focal deficits.      MD note - Psychiatry consulted to reevaluate patient.  Patient denies SI, HI, AVH.  Patient with no thoughts of wanting to harm himself or others.  Psychiatrist evaluated patient and states that he can be discharged home and referred to a substance abuse program.  Patient given return precautions.  Will have case management meet with patient to help arrange follow-up at substance abuse program and local homeless shelter.  Telepsych note - Patient cleared for discharge / no medication recommendations  CM note - SW met with patient, provided verbal and written information regarding homeless shelters, residential addictions recovery, [place], food pantries, soup kitchens, and [facility] for [facility]. Patient refused resources, advised SW "You might as well take all of that stuff. I just need you to  get me into the rescue mission." SW advised that the rescue mission is a first come first serve facility and that while this SW could secure him transportation I could not guarantee  that he would get a bed today. Patient became verbally abusive with SW and advised that if he did not have a bed there tonight he would "just be right back in the ER" for a place to stay. SW commented that the patient seemed frustrated and unable to participate in a productive conversation. Again urged the patient participate in self-determination and encouraged him to follow up with the resources provided. SW apologized for the patient's frustrations but reminded him that this is the mission's policy and this SW cannot make the shelter accept him. SW called the rescue mission, spoke with their intake coordinator Liliane Channel who states that their facility is first come first serve and that the information provided by the patient was inaccurate.   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 male with a past psychiatric history of cocaine and methamphetamine abuse, who presents via EMS due to paranoia and threats to harm himself earlier today.     The patient was triaged directly to the care of the Emergency Psychiatry team. Information related to this psychiatric evaluation was obtained through a review of available medical records, and a face-to-face interview with the patient.    At the beginning of the interview, the patient is noted to have his mattress wedged in front of his room door, making it difficult to open the door. He states that he has it like this because, "it makes me feel more comfortable." He states that he was arrested last night for disorderly conduct and states, "I was  slamming stuff around in [place] to get arrested. I needed somewhere safe to go." He was apparently released on bond by a judge today, however he did not want to leave to [facility] and was refusing to leave the jail lobby. He then allegedly was running out in the street, at which point he was brought by EMS to the emergency department. Upon arrival to the ED, the patient jumped off of the stretcher and ran down the hallway to C pod. He was then brought to E pod for evaluation. He is focused on admission throughout the interview, repeatedly stating, "I want to go to a psych hospital for a long time." He later states, "I want to get out of town." He admits to ongoing methamphetamine and crack cocaine use and states that he has been, "cooking it strong." He reports his last use of meth and cocaine was immediately prior to being arrested last night. The patient is able to answer questions logically and in an organized fashion, however when discussing any type of disposition, he begins making statements about, "demons." He does not appear to be responding to internal stimuli or internally preoccupied at any point during the interview. He is not voicing any thoughts of harming himself or others. He becomes oppositional when told that he will need to move his mattress away from the door, as  he cannot block it.     [facility] is a 41 y.o. male who presents with paranoia. History from EMS, as patient is extremely tangential. Jumped off of stretcher and ran down hallway to C pod and sat on hallway bed. Moved to [facility] and patient continues to state intermittently that he is trying to "get away from these people, and he was trying to do it safely."  Released from jail today at [place] all day and stating he was going to run into traffic. Ran through the jail after release and was restrained by police. EMS called. Patient with history of paranoid schizophrenia. Not on medications. Ems reports patient refused any VS or  examination.    Case Comments: Pt reports h/o methamphetamine, crack-cocaine, and marijuana use. Pt is interested in sober living and is currently homeless. Pt does not have an ID at this time, which a program and/or sober living requires. Pt is from [facility], he came to [place] last December when his grandmother passed away and has been homeless since. Pt told peer he is interested in [facility] so this peer recommended he submit an application in person at the cold shelter tonight so he can explain his situation. Pt told peer if the hospital discharges him he will turn around and come back in. This peer provided resources for homelessness and application for [facility].     SW consulted to assist with discharge. Pt is homeless. Pt from [place] and came to [facility] to "visit." Pt spoke with [facility] rep about sober living facilities but pt does not have an ID, which is a requirement for admission. Spoke with pt who reports he wants to go back to [facility], but has no family or friends to vouch for him. Called 3 shelters in the [place] and [place] area and all did not have beds or require that pt present in person for intake or bed.    Called pt's brother Joey at [phone] and left VM to see if pt might be able to return to [place] to his home. Spoke with aunt Jonelle Sidle at [phone] who reports she lives in [place], [place]. She reports pt has long hx of drug addiction and "trauma from childhood." Pt is unable to come to her home at discharge because she cares for a grandchild at home.     Discussed with Dr. Deatra Canter. Plan is to discharge to cold weather shelter around 8pm.      ---------------------------------------------------------------------------------------------------------------------------------------------------------------------------------------------------------------------------------------------------------------------------------------------------------------------------------------------------------------------------------------------    Psychiatric Review Of Systems:  Sleep: unable to assess  Appetite: unable to assess  Current suicidal/homicidal ideations: Endorses - vague statements of SI - limited eval r/t refusal to discuss at this time  Current auditory/visual hallucinations: unable to assess    Medications:  No current facility-administered medications for this encounter.  No current outpatient medications on file.    Allergies:  No Known Allergies    Past Psychiatric History (over the past 6 months, unless otherwise specified):  Some information obtained from chart review  Previous diagnoses/symptoms: cocaine intoxication with perceptual disturbance, methamphetamine intoxication, paranoia, depression unspecified, cocaine use, depression, anxiety, bipolar, PTSD.  Self-injurious behavior/risky thoughts or behaviors (past suicidal ideation/attempt): noted hx of SI  Violence/Risk to others (past homicidal ideation/attempt): noted hx of HI  Current psychiatric provider: unable to assess  Previous psychiatric medication trials: noted hx of Ativan, Haldol, Abilify, Atarax, Zyprexa   Previous psychiatric hospitalizations:   Previous therapy: unable to assess-none noted in chart review  Previous ECT: unable to assess-none noted  in chart review    Past Medical History: Some information obtained from chart review  History of head trauma: unable to assess - none noted in chart review  History of seizures: unable to assess - none noted in chart review  History of Surgeries or Hospitalizations:   Past Surgical History:   Procedure Laterality Date    HEENT      jaw     ORTHOPEDIC SURGERY        Other Past Medical History:  Active Ambulatory Problems     Diagnosis Date Noted    Cocaine use 06/12/2021    History of methamphetamine use 06/12/2021    Suicidal ideation 06/12/2021     Resolved Ambulatory Problems     Diagnosis Date Noted    No Resolved Ambulatory Problems     Past Medical History:   Diagnosis Date    Hypertension          Substance Use History (over the past 12 months, unless otherwise specified):  Some information obtained from chart review  Tobacco: unable to assess  Caffeine: unable to assess  Alcohol: noted hx of use  Marijuana: noted hx of use  Cocaine: noted hx of use  Opiates: unable to assess  Benzodiazepine: unable to assess  Other illicit drug usage: noted hx of meth use    Legal consequences of substance/alcohol use: unable to assess  History of substance/alcohol abuse treatment: noted possible hx of sober living programs  Readiness for substance/alcohol abuse treatment, if applicable: unable to assess    Past Family History:  Family history of mental health conditions: unable to assess  Family history of suicide? Unable to assess    Social History: Some information obtained from chart review  Childhood:  form [place] originally / also lived in [place] trauma or Abuse: possible childhood trauma - noted in chart review  Living Situation / Interest: homeless  Education: GED  Marital/Committed relationship and parenting history:  unable to assess  Occupational History:  unable to assess  Legal History: noted possible hx of charges r/t [place]  Spiritual History: unable to assess    EVALUATION    Vitals:   Vitals:    10/29/21 0420   BP: 122/69   Pulse: 95   Resp: 16   Temp: 98 ??F (36.7 ??C)   SpO2: 97%       Labs:   Recent Results (from the past 24 hour(s))   CBC with Auto Differential    Collection Time: 10/29/21  4:46 AM   Result Value Ref Range    WBC 4.5 4.3 - 11.1 K/uL    RBC 4.35 4.23 - 5.6 M/uL    Hemoglobin 14.1 13.6 - 17.2 g/dL    Hematocrit 41.5  41.1 - 50.3 %    MCV 95.4 82 - 102 FL    MCH 32.4 26.1 - 32.9 PG    MCHC 34.0 31.4 - 35.0 g/dL    RDW 13.3 11.9 - 14.6 %    Platelets 265 150 - 450 K/uL    MPV 9.5 9.4 - 12.3 FL    nRBC 0.00 0.0 - 0.2 K/uL    Differential Type AUTOMATED      Seg Neutrophils 54 43 - 78 %    Lymphocytes 34 13 - 44 %    Monocytes 7 4.0 - 12.0 %    Eosinophils % 3 0.5 - 7.8 %    Basophils 2 0.0 - 2.0 %    Immature Granulocytes 0  0.0 - 5.0 %    Segs Absolute 2.4 1.7 - 8.2 K/UL    Absolute Lymph # 1.5 0.5 - 4.6 K/UL    Absolute Mono # 0.3 0.1 - 1.3 K/UL    Absolute Eos # 0.2 0.0 - 0.8 K/UL    Basophils Absolute 0.1 0.0 - 0.2 K/UL    Absolute Immature Granulocyte 0.0 0.0 - 0.5 K/UL   CMP    Collection Time: 10/29/21  4:46 AM   Result Value Ref Range    Sodium 139 133 - 143 mmol/L    Potassium 3.9 3.5 - 5.1 mmol/L    Chloride 107 101 - 110 mmol/L    CO2 27 21 - 32 mmol/L    Anion Gap 5 2 - 11 mmol/L    Glucose 145 (H) 65 - 100 mg/dL    BUN 22 6 - 23 MG/DL    Creatinine 1.30 0.8 - 1.5 MG/DL    Est, Glom Filt Rate >60 >60 ml/min/1.42m    Calcium 9.2 8.3 - 10.4 MG/DL    Total Bilirubin 0.4 0.2 - 1.1 MG/DL    ALT 31 12 - 65 U/L    AST 49 (H) 15 - 37 U/L    Alk Phosphatase 67 50 - 136 U/L    Total Protein 7.3 6.3 - 8.2 g/dL    Albumin 4.1 3.5 - 5.0 g/dL    Globulin 3.2 2.8 - 4.5 g/dL    Albumin/Globulin Ratio 1.3 0.4 - 1.6     Salicylate    Collection Time: 10/29/21  4:46 AM   Result Value Ref Range    Salicylate, Serum 3.4 2.8 - 20.0 MG/DL   Acetaminophen Level    Collection Time: 10/29/21  4:46 AM   Result Value Ref Range    Acetaminophen Level <2 (L) 10.0 - 30.0 ug/mL       Medical Review Of Systems:  Psychological ROS: positive for - irritability, guarded, mild bizarre behavior, vague SI   Psychological ROS: negative for - disorientation or hostility       Mental Status Evaluation:  General Appearance normal body habitus, appears stated age  General Behavior No eye contact / guarded / minimally cooperative  Psychomotor Activity Normoactive  - no restlessness or tremors noted at this time  Gait and Station Did not observe, pt in bed  Speech   Minimal   Mood                          "frustrated"  Affect    irritable  Thought Process        mild bizarre  Thought Content/Perceptual Disturbances Endorsing vague SI - does not express plan or intent  Cognition/Sensorium  Alert and oriented - will give name, DOB, age, place, situation  Insight  fair  Judgment fair/poor - r/t drug abuse, non-compliance      PHQ and GAD not completed - pt would not answer questions at this time      ASSESSMENT  Psychiatric Diagnoses:  Passive suicidal ideations [R45.851 (ICD-10-CM)], Depression, unspecified depression type [F32.A (ICD-10-CM)], Polysubstance abuse ([place] [F19.10 (ICD-10-CM)], Housing instability [[phone](ICD-10-CM)], History of post traumatic stress disorder [Z86.59 (ICD-10-CM)], History of bipolar disorder [Z86.59 (ICD-10-CM)]      Plan:  41yo M with hx of cocaine intoxication with perceptual disturbance, methamphetamine intoxication, paranoia, depression unspecified, cocaine use, depression, anxiety, bipolar, PTSD, ?paranoid schizophrenia  - Pt with vague SI - does not express plan or intent  /  pt with mild bizarre statements, irritability and at times hostility / pt requesting to be restarted on medications - then hesitant but agreeable - possible stimulant induced   - Safety/Suicide precautions  - Obtain UDS - noting positive for THC, cocaine and amphetamines on 10-13-2021  - Can consider re-starting Zyprexa 5 mg po hs - to target unstable mood, aggression, positive symptoms of psychosis - monitor for dizziness, sedation, dry mouth, constipation, dyspepsia, weight gain, peripheral edema, joint pain, back pain, chest pain, extremity pain, abnormal gait, ecchymosis, tachycardia, orthostatic hypotension, rare TD, rare rash, hyperglycemia, rare NMS, rare seizures  - Consider monitoring overnight - with plan to re-evaluate in the morning  - Discussed with MD, RN,  charge nurse and CM - all in agreement with plan      Graiden Henes R. Sheppard Coil PMHNP-BC  10/29/2021  [place]

## 2021-10-30 MED ORDER — OLANZAPINE 5 MG PO TABS
5 MG | ORAL_TABLET | Freq: Every evening | ORAL | 0 refills | Status: DC
Start: 2021-10-30 — End: 2022-01-09

## 2021-10-30 MED FILL — OLANZAPINE 5 MG PO TABS: 5 MG | ORAL | Qty: 1

## 2021-10-30 NOTE — Care Coordination-Inpatient (Signed)
10/30/21 1631   Service Assessment   Patient Orientation Alert and Oriented   Cognition Alert   History Provided By Patient   Primary Caregiver Self   Support Systems None   Patient's Healthcare Decision Maker is: Legal Next of Kin   PCP Verified by CM No   Prior Functional Level Independent in ADLs/IADLs   Current Functional Level Independent in ADLs/IADLs   Social/Functional History   Type of Home Homeless   Condition of Participation: Discharge Planning   The Plan for Transition of Care is related to the following treatment goals: Shelter   The Patient and/or Patient Representative was provided with a Choice of Provider? Patient   The Patient and/Or Patient Representative agree with the Discharge Plan? Yes   Freedom of Choice list was provided with basic dialogue that supports the patient's individualized plan of care/goals, treatment preferences, and shares the quality data associated with the providers?  Yes       Patient medically cleared for dc. CM provided voucher with 30 day supply of Zyprexa $72.96

## 2021-10-30 NOTE — ED Notes (Signed)
I have reviewed discharge instructions with the patient.  The patient verbalized understanding.    Patient left ED via Discharge Method: ambulatory to Home with roundtrip.    Opportunity for questions and clarification provided.       Patient given 1 scripts.         To continue your aftercare when you leave the hospital, you may receive an automated call from our care team to check in on how you are doing.  This is a free service and part of our promise to provide the best care and service to meet your aftercare needs.??? If you have questions, or wish to unsubscribe from this service please call [phone].  Thank you for Choosing our [facility] Emergency Department.        Robie Ridge, RN  10/30/21 1230

## 2021-10-30 NOTE — Behavioral Health Treatment Team (Signed)
PROGRESS NOTE    Date of Service: 10/30/21        CC: Re-eval SI / meds / disposition        10-30-2021 - PMHNP note - Met with pt in the room / pt continues with eyes closed while talking / but alert and oriented to person, place, time and situation.  Pt without bizarre statements today.  He tells me he slept ok / he denies any current or active SI/HI - no intent or plan - he states he was not suicidal yesterday, just making statements r/t "tired" of feeling the way he was feeling yesterday - but does not elaborate.  He feels he is ok for discharge with outpt follow up - agreeable to continue the Zyprexa - needs a prescription and voucher.  Agreeable to outpt mental health referral.  Pt denies AVH and does not appear to be responding to any internal stimuli at this time.  Pt will return to the ED with any worsening of symptoms, active SI/HI/AVH or any adverse reactions to medications.     ---------------------------------------------------------------------------------------------------------------------------------------------------------------------------------------------------------------------------------------------------------------------------------------------------------------------------------------------------------------------------------------------      Jamie Casey is a 41 y.o. male with a past psychiatric history of cocaine intoxication with perceptual disturbance, methamphetamine intoxication, paranoia, depression unspecified, cocaine use, depression, anxiety, bipolar, PTSD, ?paranoid schizophrenia    For full hx / chart review - please see note dated 10-29-2021    10-29-2020 - PMHNP note -   1:30 pm - To room to speak with pt.  Pt  with eyes closed - attempted to start conversation with pt - pt refusing to answer questions at this time, stating he is "frustrated" - asked for me to return later and then would not respond to any other questions - MD aware.       3:15 pm - To room to speak with pt.   Pt in bed with eyes closed.  He will not open eyes and very limited on answering any questions at this time - continues to state he does not feel like talking right now.  He makes a few statements about "prayer" and "getting his mind right" / when asked about SI - pt states "yeah but I just need to pray and think about some things"  / pt will not tell me if he has had or is having any AVH - again making bizarre statements about his mind  / pt will not tell me about any recent drug use (noting on 10-13-2021 pt was positive for cocaine, amphetamine and THC - today, UDS still pending).  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    Noting in chart review, pts aunt endorsed pt having long hx of mental and drug problems / hx of possible childhood traumas,with SI/HI / hx of anxiety, depression, bipolar, PTSD and ?paranoid schizophrenia.    Plan:  41 yo M with hx of cocaine intoxication with perceptual disturbance, methamphetamine intoxication, paranoia, depression unspecified, cocaine use, depression, anxiety, bipolar, PTSD, ?paranoid schizophrenia  - Pt with vague SI - does not express plan or intent  / pt with mild bizarre statements, irritability and at times hostility / pt requesting to be restarted on medications - then hesitant but agreeable -  possible stimulant induced   - Safety/Suicide precautions  - Obtain UDS - noting positive for THC, cocaine and amphetamines on 10-13-2021  - Can consider re-starting Zyprexa 5 mg po hs - to target unstable mood, aggression, positive symptoms of psychosis - monitor for dizziness, sedation, dry mouth, constipation, dyspepsia, weight gain, peripheral edema, joint pain, back pain, chest pain, extremity  pain, abnormal gait, ecchymosis, tachycardia, orthostatic hypotension, rare TD, rare rash, hyperglycemia, rare NMS, rare seizures  - Consider monitoring overnight - with plan to re-evaluate in the morning  - Discussed with MD, RN, charge nurse and CM - all in agreement with plan    Psychiatric Review Of Systems:  Sleep: stable  Appetite: stable  Current suicidal/homicidal ideations: Denies any current or active SI/HI - no intent or plan  Current auditory/visual hallucinations: Denies any current or active AVH - does not appear to be responding to any internal stimuli at this time    Medications:    Current Facility-Administered Medications:     OLANZapine (ZYPREXA) tablet 5 mg, 5 mg, Oral, Nightly, Damon Steger Shirlean Schlein., MD, 5 mg at 10/29/21 2117    Current Outpatient Medications:     OLANZapine (ZYPREXA) 5 MG tablet, Take 1 tablet by mouth nightly, Disp: 30 tablet, Rfl: 0    PMH:  Past Medical History:   Diagnosis Date    Hypertension        Vitals:  Vitals:    10/29/21 2035   BP: 99/62   Pulse: 57   Resp: 24   Temp: 98.1 ??F (36.7 ??C)   SpO2: 100%       ROS:  Psychological ROS: negative for - concentration difficulties, disorientation, hallucinations, hostility, irritability, memory difficulties, sleep disturbances, suicidal ideation, or homicidal ideation     Assessment:  Psychiatric Diagnoses:  Depression, unspecified depression type [F32.A (ICD-10-CM)], Polysubstance abuse ([place]) [F19.10 (ICD-10-CM)], Housing instability [phone] (ICD-10-CM)], History of post traumatic stress disorder [Z86.59 (ICD-10-CM)], History of bipolar disorder [Z86.59 (ICD-10-CM)]      Plan:  Recommendation:   41 yo M with hx of cocaine intoxication with perceptual disturbance, methamphetamine intoxication, paranoia, depression unspecified, cocaine use, depression, anxiety, bipolar, PTSD, ?paranoid schizophrenia  - Pt denies any current or active SI/HI / remains guarded but negative for bizarre statements / does not appear to be  responding to any internal stimuli at this time  - Pt DOES NOT meet criteria for IVC at this time and ok to discharge with outpt follow up when medically cleared  - Pt agreeable to continuing medications - requests a voucher  - Can consider continuing Zyprexa 5 mg po q hs -  to target unstable mood, aggression, positive symptoms of psychosis - monitor for dizziness, sedation, dry mouth, constipation, dyspepsia, weight gain, peripheral edema, joint pain, back pain, chest pain, extremity pain, abnormal gait, ecchymosis, tachycardia, orthostatic hypotension, rare TD, rare rash, hyperglycemia, rare NMS, rare seizures  - Pt requesting a work note   - Pt states he does not need assist with transportation at discharge  - Provide with community resources  - Referral to [facility] for outpt psychiatrist follow up - pt can also be seen by [facility] mental health homeless coordinator at [facility] or [place]  - Discussed with MD, CM and charge nurse - all in agreement with plan        Aljean Horiuchi R. Luann Aspinwall PMHNP-BC  [place]

## 2021-10-30 NOTE — Discharge Instructions (Addendum)
Beginning Zyprexa 5 mg every evening  You have a voucher to assist in this  Please return with any acute changes or contact mental health for ongoing care

## 2022-01-06 DIAGNOSIS — R112 Nausea with vomiting, unspecified: Secondary | ICD-10-CM

## 2022-01-06 DIAGNOSIS — R197 Diarrhea, unspecified: Secondary | ICD-10-CM

## 2022-01-06 NOTE — ED Provider Notes (Incomplete)
Emergency Department Provider Note                   PCP:                PROVIDER UNKNOWN, AGPCNP               Age: 41 y.o.      Sex: male   Final diagnosis/impression:  No diagnosis found.   Disposition: {Dispo:59074}    MDM/Clinical Course:  Patient seen by myself at the [facility] emergency department. Patient had signs symptoms and clinical history most consistent with nausea,, W, other symptoms as described. My independent analysis/interpretation of laboratory work-up here shows [] . Radiology shows [] . While under my care, patient received IV fluids, IV Zofran, IV Reglan, p.o. famotidine.  On reassessment, []     Complexity of Problems Addressed:  Acute normotensive diagnosis/prognosis    Data Reviewed and Analyzed:  Category 1:   I reviewed external records: ED visit note from an outside group.  Reviewed 11/05/21 ER visit noting history of polysubstance use, psychiatric screening.  Reviewed 07/25/21 ED visit for stimulant induced psychosis  I ordered each unique test.  I reviewed the results of each unique test.      Category 2:   ED EKG was independently interpreted in the absence of a cardiologist.  []       Category 3: Discussion of management or test interpretation.  See MDM / clinical course section above for details    Risk of Complications and/or Morbidity of Patient Management:  Prescription drug management performed.  Shared medical decision making was utilized in creating the patients health plan today.   Work-up considered but not performed includes [] .   Shared medical decision making performed with patient/family during course of ER visit and a determination of disposition whenever appropriate.      No orders of the defined types were placed in this encounter.     ED Meds Given:  Medications - No data to display  New Prescriptions    No medications on file         ___________________    HPI: Jamie Casey is a 41 y.o. male with Hypertension present for evaluation of gastrointestinal  symptoms.  Patient was given a Hemoccult this morning that revealed multiple episodes and otherwise nonbloody emesis for the day as well as multiple episodes of nonbloody diarrhea.  Patient notes generally feeling poorly as well as flexion/nonlocalized, left-sided.  Prior to arrival, patient states he subsequently feels "off" or strange like perhaps there is some form of drug or something in a cigarette.  No chest pain, shortness of breath, generalized feeling of feeling off and not well. No palpitations or recent syncope, no recent chest pain, no fevers/chills. Patient/family denies any other evaluation for today's acute complaint. Patient/family denies any other aggravating or alleviating factors. Patient/family denies any other symptoms.    Stomach bothering  Ate burger      ROS:   All review of systems negative except as noted above in the history of the present illness.    Past Medical/ Family/ Social History:     Medical history:   Past Medical History:   Diagnosis Date    Hypertension        Surgical history:   Past Surgical History:   Procedure Laterality Date    HEENT      jaw    ORTHOPEDIC SURGERY         Family history: No family  history on file.    Social history:   Social History     Socioeconomic History    Marital status: Single   Tobacco Use    Smoking status: Every Day     Packs/day: 0.25     Types: Cigarettes    Smokeless tobacco: Never   Substance and Sexual Activity    Alcohol use: Yes    Drug use: Yes     Types: Marijuana [name]        Medications:   Previous Medications    OLANZAPINE (ZYPREXA) 5 MG TABLET    Take 1 tablet by mouth nightly      Allergies: No Known Allergies    Physical Exam   Vitals signs reviewed.   Patient Vitals for the past 4 hrs:   Temp Pulse Resp BP SpO2   01/06/22 2202 97.7 F (36.5 C) 90 22 (!) 127/94 100 %     General: Alert and oriented 4, no acute distress   Eyes: Anicteric, conjunctiva pink, PERRLA, EOMI  ENT: No nasal discharge, no gross nasal congestion  present  Pulmonary: Clear to auscultation bilaterally with symmetric chest rise, no increased work of breathing, no accessory muscle use  Cardiovascular: Regular rate and rhythm, no rub or gallop appreciated on my exam  GI: Abdomen is soft, nontender, nondistended  Musculoskeletal: No obvious joint deformity or joint effusion, normal joint range of motion  Neuro: Cranial nerves II through VII grossly intact, strength and sensation is grossly intact in the upper and lower extremities bilaterally  Skin: Skin is warm and dry  Psych: no SI/HI, flat affect, does not have disorganized speech, some increased latency    Procedures  No results found for any visits on 01/06/22.   No orders to display                   Voice dictation software was used during the making of this note.  This software is not perfect and grammatical and other typographical errors may be present.  This note has not been completely proofread for errors.

## 2022-01-06 NOTE — ED Triage Notes (Signed)
First episode of vomiting today after eating a burger from [employer].  Attempted to drink orange juice and eat fruit after and also vomited that meal.  Two additional episodes of vomiting liquid without blood, clots or black colored emesis.  Also report frequent diarrhea during the day without pain or blood.  Just before entering the emergency room was given a cigarette that felt strange (had a lump in the center) and in which he noticed white flecks.  After smoking about 3-5 drags his tired feeling became more unusual "like I'm very light."  Denies pain.  Denies visual or auditory hallucinations.  Usually smokes 5-6 cigarettes a day.

## 2022-01-07 ENCOUNTER — Inpatient Hospital Stay
Admit: 2022-01-07 | Discharge: 2022-01-07 | Disposition: A | Discharge status: Home / Self Care | Attending: Emergency Medicine

## 2022-01-07 DIAGNOSIS — R441 Visual hallucinations: Secondary | ICD-10-CM

## 2022-01-07 LAB — COMPREHENSIVE METABOLIC PANEL
ALT: 40 U/L (ref 12–65)
AST: 31 U/L (ref 15–37)
Albumin/Globulin Ratio: 1.2 (ref 0.4–1.6)
Albumin: 4 g/dL (ref 3.5–5.0)
Alk Phosphatase: 66 U/L (ref 50–136)
Anion Gap: 1 mmol/L — ABNORMAL LOW (ref 2–11)
BUN: 18 MG/DL (ref 6–23)
CO2: 25 mmol/L (ref 21–32)
Calcium: 9.4 MG/DL (ref 8.3–10.4)
Chloride: 109 mmol/L (ref 101–110)
Creatinine: 0.9 MG/DL (ref 0.8–1.5)
Est, Glom Filt Rate: >60 mL/min/{1.73_m2} (ref >60)
Globulin: 3.3 g/dL (ref 2.8–4.5)
Glucose: 96 mg/dL (ref 65–100)
Potassium: 3.5 mmol/L (ref 3.5–5.1)
Sodium: 135 mmol/L (ref 133–143)
Total Bilirubin: 0.3 MG/DL (ref 0.2–1.1)
Total Protein: 7.3 g/dL (ref 6.3–8.2)

## 2022-01-07 LAB — DRUG SCREEN PSYCH, URINE
Amphetamine, Urine: POSITIVE — AB
Benzodiazepines, Urine: NEGATIVE
Cocaine, Urine: POSITIVE — AB
Opiates, Urine: NEGATIVE
THC, TH-Cannabinol, Urine: POSITIVE — AB

## 2022-01-07 LAB — ACETAMINOPHEN LEVEL: Acetaminophen Level: <2 ug/mL — ABNORMAL LOW (ref 10.0–30.0)

## 2022-01-07 LAB — SALICYLATE LEVEL: Salicylate, Serum: <1.7 MG/DL — ABNORMAL LOW (ref 2.8–20.0)

## 2022-01-07 MED ORDER — SODIUM CHLORIDE 0.9 % IV BOLUS
0.9 % | Freq: Once | INTRAVENOUS | Status: AC
Start: 2022-01-07 — End: 2022-01-07
  Administered 2022-01-07: 03:00:00 1000 mL via INTRAVENOUS

## 2022-01-07 MED ORDER — METOCLOPRAMIDE HCL 5 MG/ML IJ SOLN
5 MG/ML | Freq: Once | INTRAMUSCULAR | Status: AC
Start: 2022-01-07 — End: 2022-01-06
  Administered 2022-01-07: 03:00:00 5 mg via INTRAVENOUS

## 2022-01-07 MED ORDER — FAMOTIDINE 20 MG PO TABS
20 MG | Freq: Once | ORAL | Status: AC
Start: 2022-01-07 — End: 2022-01-06
  Administered 2022-01-07: 03:00:00 40 mg via ORAL

## 2022-01-07 MED ORDER — DICYCLOMINE HCL 10 MG PO CAPS
10 MG | ORAL | Status: DC
Start: 2022-01-07 — End: 2022-01-06

## 2022-01-07 MED ORDER — ONDANSETRON HCL 4 MG/2ML IJ SOLN
4 MG/2ML | Freq: Once | INTRAMUSCULAR | Status: AC
Start: 2022-01-07 — End: 2022-01-06
  Administered 2022-01-07: 03:00:00 8 mg via INTRAVENOUS

## 2022-01-07 MED FILL — ONDANSETRON HCL 4 MG/2ML IJ SOLN: 4 MG/2ML | INTRAMUSCULAR | Qty: 4

## 2022-01-07 MED FILL — METOCLOPRAMIDE HCL 5 MG/ML IJ SOLN: 5 MG/ML | INTRAMUSCULAR | Qty: 2

## 2022-01-07 MED FILL — FAMOTIDINE 20 MG PO TABS: 20 MG | ORAL | Qty: 2

## 2022-01-07 NOTE — ED Provider Notes (Signed)
[name]  Emergency Department    DISPOSITION Behavioral Health Hold 01/08/2022 03:15:13 AM       ICD-10-CM    1. Visual hallucinations  R44.1       2. Suicidal ideation  R45.851         ED Course     ED Course as of 01/08/22 0732   Thu Jan 07, 2022   2349 Patient is a 41 year old male who presents to facility today with complaint of seeing demons.  He reports a history of PTSD and manic depression presenting medications.  He states he does not want to get medications that "make him feel high".  Patient denies any suicidal or homicidal ideation.  He states he put "my hands on to demons that look like people".  Patient states that he "doesn't expect anyone to understand the spiritual context".  [TT]   Fri Jan 08, 2022   0001 Patient is not wanting to cooperate with answer questions, patient appears agitated that he is having answer these questions.  Patient does not have any reported plan.  When patient is pressured he states "fine I want to kill myself, I need help" and states that his plan would be "what ever happens happens". Patient notes "what are you going to do if I leave here and go do something?".  Patient reports that his medicine is "reading the Bible and seeking God". [TT]   0016 After conversing with patient, behavior seems to be more along the lines of malingering and looking for a place to stay as opposed to a true suicidal ideation or manic depressive state.  Chart review does demonstrate patient was here yesterday and had none of these concerns or complaints. psychiatric consultation has been ordered. we will determine further plan of care pending psychiatrist recommendation. [TT]   [id number] I have reviewed the psychiatric consult note.  Their recommendation is to admit to inpatient psychiatric service.  They are recommending Zyprexa 5 mg p.o. daily for psychosis.  Patient has been placed on papers at this time. [TT]   0730 At time of shift end, I have made the oncoming  attending aware of the patient currently on behavioral hold awaiting psychiatric evaluation this morning.  I have ordered the Zyprexa 5 mg per psychiatric recommendation.  Patient currently stable resting in room quietly. [TT]      ED Course User Index  [TT] Conard Novak, PA-C     Complexity of Problems Addressed:  1 or more acute illnesses that pose a threat to life or bodily function.     Data Reviewed and Analyzed:  Category 1:   I reviewed external records: ED visit note from an outside group.  I ordered each unique test.  I interpreted the results of each unique test.        Category 2:   I interpreted the labs.  I have reviewed psychiatric consult note.    Category 3: Discussion of management or test interpretation.  See ED Course above    Is this patient to be included in the SEP-1 core measure due to severe sepsis or septic shock? No Exclusion criteria - the patient is NOT to be included for SEP-1 Core Measure due to: Infection is not suspected     HPI   Jamie Casey is a 41 y.o. male with a history of PTSD and manic depression who presents to the ED with complaint of seeing demons.  She reports that he does not expect anyone  to understand because "people do not understand spiritual things".  He denies suicidal homicidal ideation.  He does report that he "put my hands on to demons that looks like humans".  Patient denies any self-harm.  He reports he feels harm will come to him because "I read and follow the Bible".  Patient does not want to take any medications and states he has never taken any other medications prescribed him in the past.  He states he does not want to be on any medications that "make me feel high".  He denies drug use stating that he only still use drugs with his "last girl" has not been using so it should not be in his system.    ROS   Review of Systems   Constitutional:  Negative for chills and fever.   Respiratory:  Negative for shortness of breath.    Cardiovascular:  Negative for  chest pain.   Gastrointestinal:  Negative for abdominal pain.   Musculoskeletal:  Negative for gait problem.   Neurological:  Negative for headaches.   Psychiatric/Behavioral:  Positive for hallucinations and suicidal ideas. Negative for agitation, behavioral problems and self-injury.    All other systems reviewed and are negative.    History     Past Medical History:   Diagnosis Date    Hypertension      Past Surgical History:   Procedure Laterality Date    HEENT      jaw    ORTHOPEDIC SURGERY       No family history on file.  No Known Allergies    Physical Exam     Vitals:    01/07/22 2121 01/07/22 2158 01/07/22 2216   BP: 117/81     Pulse: 71     Resp:  17    Temp: 98.1 F (36.7 C)     SpO2: 100%     Weight:   155 lb (70.3 kg)   Height:   5\' 9"  (1.753 m)     Nursing note and vitals reviewed.    Constitutional: Well developed, NAD  HEENT: Atraumatic, conjugate gaze, EOM intact  Neck: Supple  Cardiovascular: Regular rate and rhythm, no murmur appreciated.  Respiratory: Effort normal. No respiratory distress. Lungs CTAB.  Gastrointestinal: Bowel sounds present. Non-distended. No guarding or rebound. Non-tender.  MSK: No deformities appreciated. No peripheral edema.  Skin: Skin is warm and dry. No rash appreciated.  Neuro: Alert and oriented, moves all four extremities.  Psych: Agitated with minimal cooperation    Procedures   Procedures    MDM     Labs Reviewed   ACETAMINOPHEN LEVEL - Abnormal; Notable for the following components:       Result Value    Acetaminophen Level <2 (*)     All other components within normal limits   CBC WITH AUTO DIFFERENTIAL - Abnormal; Notable for the following components:    MPV 9.3 (*)     All other components within normal limits   COMPREHENSIVE METABOLIC PANEL - Abnormal; Notable for the following components:    Potassium 3.4 (*)     All other components within normal limits   SALICYLATE LEVEL - Abnormal; Notable for the following components:    Salicylate, Serum 1.8 (*)     All  other components within normal limits   ETHANOL     Medications   OLANZapine (ZYPREXA) tablet 5 mg (has no administration in time range)     No orders to display  Voice dictation software was used during the making of this note. This software is not perfect and grammatical and other typographical errors may be present. This note has not been completely proofread for errors.     [facility], PA-C  01/08/22 [phone]

## 2022-01-07 NOTE — ED Triage Notes (Signed)
Ambulatory to triage. States "a lot of things going on in my mind" and "the devil". States does not take medications just reads the bible. States feels as though not safe alone. Feels as though "demons and evil doers seeking me" states harming himself. When asked if wanted to harm others states "with the truth daily" then when asked physically harm someone, states yes but does not elaborate further other than "put my hands on two people" elaborates thought they were demons. When asked if wanted to self harm or about SI states does not have a plan just "how ever it happens" charge RN made aware of patient situation.

## 2022-01-07 NOTE — Consults (Signed)
Arranged [facility] tele psyche consult via website. Connect ID [id number].

## 2022-01-07 NOTE — ED Notes (Signed)
Constant Observer No   Constant Observer Oriented N/A   High risk patients are in line of sight at all times Yes   Excess equipment/medical supplies not necessary for the care of the patient removed Yes   All sharp or dangerous objects are removed from room: including but not limited to belts, pens & pencils, needles, medications, cosmetics, lighters, matches, nail files, watches, necklaces, glass objects, razors, razor blades, knives, aerosol sprays, drawstring pants, shoes, cords (telephone, call bells, etc.) cleaning wipes or other cleaning items, aluminum cans, not permanently attached wall dcor Yes   Telephone/cell phone removed as well as TV remote (batteries can be swallowed) Yes   Patient belongings removed and labeled at nurses station Yes   Excess linen is removed from room Yes   All plastic bags are removed from the room and replaced with paper trash bags Yes   Patient is in paper scrubs or appropriate gown and using hospital socks with rubber soles Yes   No metal, hard eating utensils or hard plates are on meal tray Yes   Remove all cleaning agents used by Environmental Services Yes   If Crucifix is hanging on a nail, remove Crucifix as well as the nail No - NA       *If any question above is answered "No," documentation is required.        Katrine Coho, RN  01/07/22 2226

## 2022-01-07 NOTE — ED Notes (Signed)
I have reviewed discharge instructions with the patient.  The patient verbalized understanding.    Patient left ED via Discharge Method: ambulatory to Home with self.    Opportunity for questions and clarification provided.       Patient given 0 scripts.         To continue your aftercare when you leave the hospital, you may receive an automated call from our care team to check in on how you are doing.  This is a free service and part of our promise to provide the best care and service to meet your aftercare needs." If you have questions, or wish to unsubscribe from this service please call [phone].  Thank you for Choosing our [facility] Emergency Department.        Thurnell Lose, RN  01/07/22 [phone]

## 2022-01-07 NOTE — Discharge Instructions (Signed)
Return as needed questions concerns or worsening symptoms, jugular the recurrent/intractable nausea/vomiting, recurrent/unremitting diarrheal symptoms, fainting episodes

## 2022-01-07 NOTE — ED Notes (Signed)
Patient is resting in bed comfortably with eyes closed. No visual signs of apparent distress. Chest expansion is equal, unlabored, and symmetrical. Safety precautions in place.       Mackie Pai, RN  01/08/22 0004

## 2022-01-07 NOTE — ED Provider Notes (Signed)
Patient reports no hallucinations.  No further thoughts of self-harm.  He is alert and agreeable.  Reevaluated by telepsychiatry.  Recommended de-certification for discharge.  Decertification papers completed.  Will refill Zyprexa.  Advised importance of mental health follow-up.     Waylan Rocher, MD  01/09/22 1110

## 2022-01-07 NOTE — ED Provider Notes (Signed)
[facility] Psychiatric RECHECK NOTE for 01/08/2022  Arrival Date/Time: 01/07/2022 10:02 PM      Jamie Casey  MRN: [id number]    Date of Birth: 1980/11/21   41 y.o. male    [facility] EMERGENCY DEPT ER10/10  Reeval on 01/08/2022 @ 10:38 AM       He is on commitment papers. The papers expire on: 01/10/22 1055    He has completed a tele-psych evaluation.     Jamie Casey is a 41 y.o. male originally presented for suicidal thoughts and mental health problem.      Interval history: Patient is not expressing suicidal thoughts but does still think that demons are out to get him.    Vitals:    01/08/22 0757   BP: 119/76   Pulse: 68   Resp: 18   Temp: 98.1 F (36.7 C)   SpO2: 98%      Current Facility-Administered Medications   Medication Dose Route Frequency    OLANZapine (ZYPREXA) tablet 5 mg  5 mg Oral Nightly     Current Outpatient Medications   Medication Sig    OLANZapine (ZYPREXA) 5 MG tablet Take 1 tablet by mouth nightly       Assessment and Plan:  Patient has a history of PTSD and major depression who presented for visual hallucinations.  Patient has been evaluated by psychiatrist who recommends inpatient psychiatric treatment.  Patient is under IVC commitment paperwork.  Zyprexa has been ordered.  Case management is working on psychiatric placement.      Jamie Hawthorne, MD; 01/08/2022 10:38 AM ===============                      Jamie Hawthorne, MD  01/08/22 1521

## 2022-01-08 ENCOUNTER — Inpatient Hospital Stay
Admit: 2022-01-08 | Discharge: 2022-01-09 | Disposition: A | Discharge status: Home / Self Care | Attending: Emergency Medicine

## 2022-01-08 LAB — CBC WITH AUTO DIFFERENTIAL
Absolute Immature Granulocyte: 0 10*3/uL (ref 0.0–0.5)
Basophils %: 1 % (ref 0.0–2.0)
Basophils Absolute: 0.1 10*3/uL (ref 0.0–0.2)
Eosinophils %: 2 % (ref 0.5–7.8)
Eosinophils Absolute: 0.1 10*3/uL (ref 0.0–0.8)
Hematocrit: 46.5 % (ref 41.1–50.3)
Hemoglobin: 15.4 g/dL (ref 13.6–17.2)
Immature Granulocytes: 0 % (ref 0.0–5.0)
Lymphocytes %: 34 % (ref 13–44)
Lymphocytes Absolute: 2.3 10*3/uL (ref 0.5–4.6)
MCH: 32.4 PG (ref 26.1–32.9)
MCHC: 33.1 g/dL (ref 31.4–35.0)
MCV: 97.9 FL (ref 82–102)
MPV: 9.3 FL — ABNORMAL LOW (ref 9.4–12.3)
Monocytes %: 6 % (ref 4.0–12.0)
Monocytes Absolute: 0.4 10*3/uL (ref 0.1–1.3)
Neutrophils %: 57 % (ref 43–78)
Neutrophils Absolute: 3.8 10*3/uL (ref 1.7–8.2)
Platelets: 309 10*3/uL (ref 150–450)
RBC: 4.75 M/uL (ref 4.23–5.6)
RDW: 13.7 % (ref 11.9–14.6)
WBC: 6.7 10*3/uL (ref 4.3–11.1)
nRBC: 0 10*3/uL (ref 0.0–0.2)

## 2022-01-08 LAB — COMPREHENSIVE METABOLIC PANEL
ALT: 42 U/L (ref 12–65)
AST: 36 U/L (ref 15–37)
Albumin/Globulin Ratio: 1.2 (ref 0.4–1.6)
Albumin: 4.4 g/dL (ref 3.5–5.0)
Alk Phosphatase: 83 U/L (ref 50–136)
Anion Gap: 5 mmol/L (ref 2–11)
BUN: 14 MG/DL (ref 6–23)
CO2: 30 mmol/L (ref 21–32)
Calcium: 9.4 MG/DL (ref 8.3–10.4)
Chloride: 105 mmol/L (ref 101–110)
Creatinine: 1.3 MG/DL (ref 0.8–1.5)
Est, Glom Filt Rate: >60 mL/min/{1.73_m2} (ref >60)
Globulin: 3.6 g/dL (ref 2.8–4.5)
Glucose: 66 mg/dL (ref 65–100)
Potassium: 3.4 mmol/L — ABNORMAL LOW (ref 3.5–5.1)
Sodium: 140 mmol/L (ref 133–143)
Total Bilirubin: 0.4 MG/DL (ref 0.2–1.1)
Total Protein: 8 g/dL (ref 6.3–8.2)

## 2022-01-08 LAB — URINE DRUG SCREEN
Amphetamine, Urine: NEGATIVE
Barbiturates, Urine: NEGATIVE
Benzodiazepines, Urine: NEGATIVE
Cocaine, Urine: POSITIVE — AB
Methadone, Urine: NEGATIVE
Opiates, Urine: NEGATIVE
PCP, Urine: NEGATIVE
THC, TH-Cannabinol, Urine: POSITIVE — AB

## 2022-01-08 LAB — ACETAMINOPHEN LEVEL: Acetaminophen Level: <2 ug/mL — ABNORMAL LOW (ref 10.0–30.0)

## 2022-01-08 LAB — SALICYLATE LEVEL: Salicylate, Serum: 1.8 MG/DL — ABNORMAL LOW (ref 2.8–20.0)

## 2022-01-08 LAB — ETHANOL: Ethanol Lvl: <3 MG/DL

## 2022-01-08 MED ORDER — OLANZAPINE 5 MG PO TABS
5 MG | Freq: Every evening | ORAL | Status: AC
Start: 2022-01-08 — End: 2022-01-09

## 2022-01-08 NOTE — ED Notes (Signed)
Patient is resting in bed comfortably with eyes closed. No visual signs of apparent distress. Chest expansion is equal, unlabored, and symmetrical. Safety precautions in place.       Mackie Pai, RN  01/08/22 [phone]

## 2022-01-08 NOTE — ED Notes (Addendum)
Patient resting at this time. No signs or symptoms of distress. Respirations even and unlabored. Safety precautions in place.       Doylene Bode, RN  01/09/22 0022       Doylene Bode, RN  01/09/22 0130

## 2022-01-08 NOTE — ED Notes (Signed)
Pt is resting quietly on stretcher with eyes closed in NAD. Respirations are regular and non-labored. All safety measures remain in place.      Doylene Bode, RN  01/08/22 2216

## 2022-01-08 NOTE — ED Notes (Signed)
Patient is resting in bed comfortably with eyes closed. No visual signs of apparent distress. Chest expansion is equal, unlabored, and symmetrical. Safety precautions in place.       Mackie Pai, RN  01/08/22 [phone]

## 2022-01-08 NOTE — ED Notes (Signed)
Received report from [facility] and assumed care of pt at this time.     Doylene Bode, RN  01/08/22 1913

## 2022-01-08 NOTE — Care Coordination-Inpatient (Signed)
Patient is wandering hallway asking for the gate in the room to be lifted so he can get snacks out of his bag.  He is provided additional food/lunch box from the floor instead.

## 2022-01-08 NOTE — ED Notes (Signed)
Patient is resting in bed comfortably with eyes closed. No visual signs of apparent distress. Chest expansion is equal, unlabored, and symmetrical. Safety precautions in place.       Mackie Pai, RN  01/08/22 [phone]

## 2022-01-08 NOTE — ED Notes (Signed)
Pt is resting quietly in bed with his eyes closed at this time. Respirations are regular and nonlabored.      Doylene Bode, RN  01/08/22 [phone]

## 2022-01-08 NOTE — ED Notes (Signed)
Patient is resting in bed comfortably with eyes closed. No visual signs of apparent distress. Chest expansion is equal, unlabored, and symmetrical. Safety precautions in place.      Mackie Pai, RN  01/08/22 [phone]

## 2022-01-08 NOTE — ED Notes (Signed)
Constant Observer No   Constant Observer Oriented N/A   High risk patients are in line of sight at all times Yes   Excess equipment/medical supplies not necessary for the care of the patient removed Yes   All sharp or dangerous objects are removed from room: including but not limited to belts, pens & pencils, needles, medications, cosmetics, lighters, matches, nail files, watches, necklaces, glass objects, razors, razor blades, knives, aerosol sprays, drawstring pants, shoes, cords (telephone, call bells, etc.) cleaning wipes or other cleaning items, aluminum cans, not permanently attached wall dcor Yes   Telephone/cell phone removed as well as TV remote (batteries can be swallowed) Yes   Patient belongings removed and labeled at nurses station Yes   Excess linen is removed from room Yes   All plastic bags are removed from the room and replaced with paper trash bags Yes   Patient is in paper scrubs or appropriate gown and using hospital socks with rubber soles Yes   No metal, hard eating utensils or hard plates are on meal tray Yes   Remove all cleaning agents used by [employer] Yes   If Crucifix is hanging on a nail, remove Crucifix as well as the nail Yes       *If any question above is answered "No," documentation is required.       Clayborn Heron, RN  01/08/22 [phone]

## 2022-01-08 NOTE — ED Notes (Signed)
Patient is resting in bed comfortably with eyes closed. No visual signs of apparent distress. Chest expansion is equal, unlabored, and symmetrical. Safety precautions in place.       Mackie Pai, RN  01/08/22 [phone]

## 2022-01-08 NOTE — ED Notes (Signed)
Patient resting at this time. No signs or symptoms of distress. Respirations even and unlabored. Safety precautions in place.      Doylene Bode, RN  01/08/22 2216

## 2022-01-08 NOTE — Care Coordination-Inpatient (Signed)
Patient is unable to give his new address but confirms the address as listed is where he receives mail. Patient does not name next of kin and reports working in [employer].  He states someone offered him insurance which is supposed to start May 1.  He does not know the name of the insurance company or the person he got insurance through. He has his bible with him and states he will fight the non-believers.  Patient is reporting a need to feel safe even if that is in jail because the demons are after him and they are non believers.    Referrals made.  Security advised of additional need to round.    01/08/22 0844   Service Assessment   Patient Orientation Other (see comment)  (believes demons are coming to hurt him and states he will fight whoever tries to hurt him.  States he needs to be kept safe even if that means jail.)   Cognition   (awake)   History Provided By Patient   Support Systems Other (Comment)  (unable to obtain this information from patient)   Patient's Healthcare Decision Maker is:   (does not name)   PCP Verified by CM No   Prior Functional Level Independent in ADLs/IADLs   Current Functional Level Independent in ADLs/IADLs   Can patient return to prior living arrangement Unknown at present   Ability to make needs known: Unable   Family able to assist with home care needs: Other (comment)  (unknown)   Would you like for me to discuss the discharge plan with any other family members/significant others, and if so, who? No   Financial Resources None   [employer] None   Social/Functional History   Lives With Other (comment)  (demons)   Type of Home   (unable to give an address)   Type of Occupation patient states he works [employer]

## 2022-01-08 NOTE — ED Notes (Signed)
Patient is resting in bed comfortably with eyes closed. No visual signs of apparent distress. Chest expansion is equal, unlabored, and symmetrical. Safety precautions in place.       Mackie Pai, RN  01/08/22 [phone]

## 2022-01-08 NOTE — ED Notes (Signed)
Patient is resting in bed comfortably with eyes closed. No visual signs of apparent distress. Chest expansion is equal, unlabored, and symmetrical. Safety precautions in place.       Mackie Pai, RN  01/08/22 [phone]

## 2022-01-08 NOTE — ED Notes (Addendum)
Patient resting at this time. No signs or symptoms of distress. Respirations even and unlabored. Safety precautions in place.      Doylene Bode, RN  01/09/22 0021       Doylene Bode, RN  01/09/22 0131

## 2022-01-08 NOTE — ED Notes (Signed)
Constant Observer No   Constant Observer Oriented N/A   High risk patients are in line of sight at all times NA   Excess equipment/medical supplies not necessary for the care of the patient removed Yes   All sharp or dangerous objects are removed from room: including but not limited to belts, pens & pencils, needles, medications, cosmetics, lighters, matches, nail files, watches, necklaces, glass objects, razors, razor blades, knives, aerosol sprays, drawstring pants, shoes, cords (telephone, call bells, etc.) cleaning wipes or other cleaning items, aluminum cans, not permanently attached wall dcor Yes   Telephone/cell phone removed as well as TV remote (batteries can be swallowed) Yes   Patient belongings removed and labeled at nurses station Yes   Excess linen is removed from room Yes   All plastic bags are removed from the room and replaced with paper trash bags Yes   Patient is in paper scrubs or appropriate gown and using hospital socks with rubber soles Yes   No metal, hard eating utensils or hard plates are on meal tray Yes   Remove all cleaning agents used by [employer] Yes   If Crucifix is hanging on a nail, remove Crucifix as well as the nail Yes       *If any question above is answered "No," documentation is required.  Pt is not considered high risk.     Reatha Harps, RN  01/08/22 2214

## 2022-01-09 MED ORDER — OLANZAPINE 5 MG PO TABS
5 MG | ORAL_TABLET | Freq: Every evening | ORAL | 0 refills | Status: AC
Start: 2022-01-09 — End: ?

## 2022-01-09 MED FILL — OLANZAPINE 5 MG PO TABS: 5 MG | ORAL | Qty: 1

## 2022-01-09 NOTE — ED Notes (Signed)
Patient resting at this time. No signs or symptoms of distress. Respirations even and unlabored. Safety precautions in place.      Doylene Bode, RN  01/09/22 [phone]

## 2022-01-09 NOTE — ED Notes (Signed)
[facility] Psychiatry consult paged.      Annye Rusk  01/09/22 1021

## 2022-01-09 NOTE — ED Notes (Signed)
Constant Observer No   Constant Observer Oriented no   High risk patients are in line of sight at all times No - pt not high rx   Excess equipment/medical supplies not necessary for the care of the patient removed Yes   All sharp or dangerous objects are removed from room: including but not limited to belts, pens & pencils, needles, medications, cosmetics, lighters, matches, nail files, watches, necklaces, glass objects, razors, razor blades, knives, aerosol sprays, drawstring pants, shoes, cords (telephone, call bells, etc.) cleaning wipes or other cleaning items, aluminum cans, not permanently attached wall dcor Yes   Telephone/cell phone removed as well as TV remote (batteries can be swallowed) Yes   Patient belongings removed and labeled at nurses station Yes   Excess linen is removed from room Yes   All plastic bags are removed from the room and replaced with paper trash bags Yes   Patient is in paper scrubs or appropriate gown and using hospital socks with rubber soles Yes   No metal, hard eating utensils or hard plates are on meal tray Yes   Remove all cleaning agents used by [employer] Yes   If Crucifix is hanging on a nail, remove Crucifix as well as the nail Yes       *If any question above is answered "No," documentation is required.       Geraldine Contras, RN  01/09/22 [phone]

## 2022-01-09 NOTE — ED Notes (Signed)
Patient resting at this time. No signs or symptoms of distress. Respirations even and unlabored. Safety precautions in place.      Doylene Bode, RN  01/09/22 [phone]

## 2022-01-09 NOTE — ED Notes (Signed)
Patient resting at this time. No signs or symptoms of distress. Respirations even and unlabored. Safety precautions in place.       Doylene Bode, RN  01/09/22 [phone]

## 2022-01-09 NOTE — ED Notes (Signed)
Patient resting at this time. No signs or symptoms of distress. Respirations even and unlabored. Safety precautions in place.      Doylene Bode, RN  01/09/22 [phone]

## 2022-01-09 NOTE — ED Notes (Signed)
Patient resting at this time. No signs or symptoms of distress. Respirations even and unlabored. Safety precautions in place.       Doylene Bode, RN  01/09/22 [phone]

## 2022-01-09 NOTE — ED Notes (Addendum)
Patient resting at this time. No signs or symptoms of distress. Respirations even and unlabored. Safety precautions in place.         Doylene Bode, RN  01/09/22 0023       Doylene Bode, RN  01/09/22 0131

## 2022-01-09 NOTE — ED Notes (Signed)
Constant Observer No   Constant Observer Oriented N/A   High risk patients are in line of sight at all times No - pt. Not high risk   Excess equipment/medical supplies not necessary for the care of the patient removed Yes   All sharp or dangerous objects are removed from room: including but not limited to belts, pens & pencils, needles, medications, cosmetics, lighters, matches, nail files, watches, necklaces, glass objects, razors, razor blades, knives, aerosol sprays, drawstring pants, shoes, cords (telephone, call bells, etc.) cleaning wipes or other cleaning items, aluminum cans, not permanently attached wall dcor Yes   Telephone/cell phone removed as well as TV remote (batteries can be swallowed) Yes   Patient belongings removed and labeled at nurses station Yes   Excess linen is removed from room Yes   All plastic bags are removed from the room and replaced with paper trash bags Yes   Patient is in paper scrubs or appropriate gown and using hospital socks with rubber soles Yes   No metal, hard eating utensils or hard plates are on meal tray Yes   Remove all cleaning agents used by [employer] Yes   If Crucifix is hanging on a nail, remove Crucifix as well as the nail Yes       *If any question above is answered "No," documentation is required.       Geraldine Contras, RN  01/09/22 [phone]

## 2022-01-09 NOTE — ED Notes (Signed)
Constant Observer No   Constant Observer Oriented N/A   High risk patients are in line of sight at all times No - pt. Not high rx   Excess equipment/medical supplies not necessary for the care of the patient removed Yes   All sharp or dangerous objects are removed from room: including but not limited to belts, pens & pencils, needles, medications, cosmetics, lighters, matches, nail files, watches, necklaces, glass objects, razors, razor blades, knives, aerosol sprays, drawstring pants, shoes, cords (telephone, call bells, etc.) cleaning wipes or other cleaning items, aluminum cans, not permanently attached wall dcor Yes   Telephone/cell phone removed as well as TV remote (batteries can be swallowed) Yes   Patient belongings removed and labeled at nurses station Yes   Excess linen is removed from room Yes   All plastic bags are removed from the room and replaced with paper trash bags Yes   Patient is in paper scrubs or appropriate gown and using hospital socks with rubber soles Yes   No metal, hard eating utensils or hard plates are on meal tray Yes   Remove all cleaning agents used by [employer] Yes   If Crucifix is hanging on a nail, remove Crucifix as well as the nail Yes       *If any question above is answered "No," documentation is required.       Geraldine Contras, RN  01/09/22 [phone]

## 2022-01-09 NOTE — ED Notes (Signed)
Constant Observer No   Constant Observer Oriented N/A   High risk patients are in line of sight at all times No - pt. Not considered high risk   Excess equipment/medical supplies not necessary for the care of the patient removed Yes   All sharp or dangerous objects are removed from room: including but not limited to belts, pens & pencils, needles, medications, cosmetics, lighters, matches, nail files, watches, necklaces, glass objects, razors, razor blades, knives, aerosol sprays, drawstring pants, shoes, cords (telephone, call bells, etc.) cleaning wipes or other cleaning items, aluminum cans, not permanently attached wall dcor Yes   Telephone/cell phone removed as well as TV remote (batteries can be swallowed) Yes   Patient belongings removed and labeled at nurses station Yes   Excess linen is removed from room Yes   All plastic bags are removed from the room and replaced with paper trash bags Yes   Patient is in paper scrubs or appropriate gown and using hospital socks with rubber soles Yes   No metal, hard eating utensils or hard plates are on meal tray Yes   Remove all cleaning agents used by [employer] Yes   If Crucifix is hanging on a nail, remove Crucifix as well as the nail Yes       *If any question above is answered "No," documentation is required.       Geraldine Contras, RN  01/09/22 [phone]

## 2022-01-09 NOTE — ED Notes (Signed)
I have reviewed discharge instructions with the patient.  The patient verbalized understanding.    Patient left ED via Discharge Method: ambulatory to Home with self.    Opportunity for questions and clarification provided.       Patient given 1 scripts.         To continue your aftercare when you leave the hospital, you may receive an automated call from our care team to check in on how you are doing.  This is a free service and part of our promise to provide the best care and service to meet your aftercare needs." If you have questions, or wish to unsubscribe from this service please call [phone].  Thank you for Choosing our [facility] Emergency Department.       Geraldine Contras, RN  01/09/22 1115

## 2022-01-09 NOTE — ED Notes (Signed)
Patient resting at this time. No signs or symptoms of distress. Respirations even and unlabored. Safety precautions in place.       Doylene Bode, RN  01/09/22 [phone]

## 2022-01-09 NOTE — Discharge Instructions (Addendum)
Resume Zyprexa.  Follow-up closely with [facility] health.  Return for worsening or concerning symptoms.  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

## 2022-01-09 NOTE — ED Notes (Signed)
Patient resting at this time. No signs or symptoms of distress. Respirations even and unlabored. Safety precautions in place.      Doylene Bode, RN  01/09/22 [phone]

## 2022-01-23 DIAGNOSIS — S62336A Displaced fracture of neck of fifth metacarpal bone, right hand, initial encounter for closed fracture: Secondary | ICD-10-CM

## 2022-01-23 DIAGNOSIS — S62339A Displaced fracture of neck of unspecified metacarpal bone, initial encounter for closed fracture: Secondary | ICD-10-CM
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
## 2022-01-24 ENCOUNTER — Inpatient Hospital Stay
Admit: 2022-01-24 | Discharge: 2022-01-24 | Disposition: A | Discharge status: Home / Self Care | Payer: BLUE CROSS/BLUE SHIELD | Attending: Emergency Medicine

## 2022-01-24 ENCOUNTER — Emergency Department: Admit: 2022-01-24 | Payer: BLUE CROSS/BLUE SHIELD | Primary: Diagnostic Radiology

## 2022-01-24 MED ORDER — NAPROXEN 500 MG PO TABS
500 MG | ORAL_TABLET | Freq: Two times a day (BID) | ORAL | 0 refills | Status: AC
Start: 2022-01-24 — End: ?

## 2022-01-24 MED ORDER — HYDROCODONE-ACETAMINOPHEN 5-325 MG PO TABS
5-325 MG | ORAL | Status: AC
Start: 2022-01-24 — End: 2022-01-24
  Administered 2022-01-24: 05:00:00 1 via ORAL

## 2022-01-24 MED FILL — HYDROCODONE-ACETAMINOPHEN 5-325 MG PO TABS: 5-325 MG | ORAL | Qty: 1

## 2022-01-24 NOTE — Discharge Instructions (Addendum)
Please alternate the medication prescribed with tylenol for pain control.

## 2022-01-24 NOTE — ED Notes (Signed)
I have reviewed discharge instructions with the patient.  The patient verbalized understanding.    Patient left ED via Discharge Method: ambulatory to Home .    Opportunity for questions and clarification provided.       Patient given 1 scripts.         To continue your aftercare when you leave the hospital, you may receive an automated call from our care team to check in on how you are doing.  This is a free service and part of our promise to provide the best care and service to meet your aftercare needs." If you have questions, or wish to unsubscribe from this service please call [phone].  Thank you for Choosing our [facility] Emergency Department.       Pricilla Riffle, RN  01/24/22 [phone]

## 2022-04-05 ENCOUNTER — Inpatient Hospital Stay
Admit: 2022-04-05 | Discharge: 2022-04-05 | Disposition: A | Discharge status: Home / Self Care | Payer: BLUE CROSS/BLUE SHIELD | Attending: Emergency Medicine

## 2022-04-05 DIAGNOSIS — L03119 Cellulitis of unspecified part of limb: Secondary | ICD-10-CM

## 2022-04-05 LAB — CBC WITH AUTO DIFFERENTIAL
Absolute Immature Granulocyte: 0 10*3/uL (ref 0.0–0.5)
Basophils %: 1 % (ref 0.0–2.0)
Basophils Absolute: 0.1 10*3/uL (ref 0.0–0.2)
Eosinophils %: 0 % — ABNORMAL LOW (ref 0.5–7.8)
Eosinophils Absolute: 0 10*3/uL (ref 0.0–0.8)
Hematocrit: 44.5 % (ref 41.1–50.3)
Hemoglobin: 15 g/dL (ref 13.6–17.2)
Immature Granulocytes: 0 % (ref 0.0–5.0)
Lymphocytes %: 38 % (ref 13–44)
Lymphocytes Absolute: 2.5 10*3/uL (ref 0.5–4.6)
MCH: 32.7 PG (ref 26.1–32.9)
MCHC: 33.7 g/dL (ref 31.4–35.0)
MCV: 96.9 FL (ref 82–102)
MPV: 9.6 FL (ref 9.4–12.3)
Monocytes %: 12 % (ref 4.0–12.0)
Monocytes Absolute: 0.8 10*3/uL (ref 0.1–1.3)
Neutrophils %: 49 % (ref 43–78)
Neutrophils Absolute: 3.3 10*3/uL (ref 1.7–8.2)
Platelets: 287 10*3/uL (ref 150–450)
RBC: 4.59 M/uL (ref 4.23–5.6)
RDW: 13.4 % (ref 11.9–14.6)
WBC: 6.7 10*3/uL (ref 4.3–11.1)
nRBC: 0 10*3/uL (ref 0.0–0.2)

## 2022-04-05 LAB — COMPREHENSIVE METABOLIC PANEL
ALT: 33 U/L (ref 12–65)
AST: 44 U/L — ABNORMAL HIGH (ref 15–37)
Albumin/Globulin Ratio: 1.5 (ref 0.4–1.6)
Albumin: 5.1 g/dL — ABNORMAL HIGH (ref 3.5–5.0)
Alk Phosphatase: 67 U/L (ref 50–136)
Anion Gap: 14 mmol/L — ABNORMAL HIGH (ref 2–11)
BUN: 29 MG/DL — ABNORMAL HIGH (ref 6–23)
CO2: 20 mmol/L — ABNORMAL LOW (ref 21–32)
Calcium: 10 MG/DL (ref 8.3–10.4)
Chloride: 104 mmol/L (ref 101–110)
Creatinine: 1.8 MG/DL — ABNORMAL HIGH (ref 0.8–1.5)
Est, Glom Filt Rate: 49 mL/min/{1.73_m2} — ABNORMAL LOW (ref >60)
Globulin: 3.5 g/dL (ref 2.8–4.5)
Glucose: 130 mg/dL — ABNORMAL HIGH (ref 65–100)
Potassium: 3.8 mmol/L (ref 3.5–5.1)
Sodium: 138 mmol/L (ref 133–143)
Total Bilirubin: 1 MG/DL (ref 0.2–1.1)
Total Protein: 8.6 g/dL — ABNORMAL HIGH (ref 6.3–8.2)

## 2022-04-05 MED ORDER — LOTRIMIN AF POWDER 2 % EX AERP
2 % | Freq: Two times a day (BID) | CUTANEOUS | 0 refills | Status: AC
Start: 2022-04-05 — End: 2022-05-03

## 2022-04-05 MED ORDER — CEPHALEXIN 500 MG PO CAPS
500 MG | ORAL_CAPSULE | Freq: Three times a day (TID) | ORAL | 0 refills | Status: AC
Start: 2022-04-05 — End: 2022-04-12

## 2022-04-05 NOTE — ED Notes (Signed)
Pt refused to sign discharge paperwork or take discharge instructions.      Lenard Lance, RN  04/05/22 [phone]

## 2022-04-05 NOTE — ED Notes (Signed)
I have reviewed discharge instructions with the patient.  The patient verbalized understanding.    Patient left ED via Discharge Method: ambulatory to Home with self.  Opportunity for questions and clarification provided.       Patient given 2 scripts.         To continue your aftercare when you leave the hospital, you may receive an automated call from our care team to check in on how you are doing.  This is a free service and part of our promise to provide the best care and service to meet your aftercare needs." If you have questions, or wish to unsubscribe from this service please call [phone].  Thank you for Choosing our [facility] Emergency Department.       Lenard Lance, RN  04/05/22 [phone]
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
## 2022-04-05 NOTE — ED Triage Notes (Signed)
Pt ambulatory to ED with complaints of "I think they poisoned my feet". When asked further questions pt states "I don't wanna talk, I just want to be treated for this shit."

## 2022-04-05 NOTE — Discharge Instructions (Addendum)
Course antibiotic and antifungal.  Follow-up with your doctor for checkup.  Return if worsening symptoms

## 2022-07-20 ENCOUNTER — Emergency Department: Admit: 2022-07-20 | Payer: BLUE CROSS/BLUE SHIELD | Primary: Diagnostic Radiology

## 2022-07-20 ENCOUNTER — Inpatient Hospital Stay
Admit: 2022-07-20 | Discharge: 2022-07-20 | Disposition: A | Discharge status: Home / Self Care | Payer: BLUE CROSS/BLUE SHIELD | Attending: Emergency Medicine

## 2022-07-20 DIAGNOSIS — R0602 Shortness of breath: Secondary | ICD-10-CM

## 2022-07-20 DIAGNOSIS — R0600 Dyspnea, unspecified: Secondary | ICD-10-CM

## 2022-07-20 LAB — CBC WITH AUTO DIFFERENTIAL
Absolute Immature Granulocyte: 0 10*3/uL (ref 0.0–0.5)
Basophils %: 1 % (ref 0.0–2.0)
Basophils Absolute: 0.1 10*3/uL (ref 0.0–0.2)
Eosinophils %: 3 % (ref 0.5–7.8)
Eosinophils Absolute: 0.3 10*3/uL (ref 0.0–0.8)
Hematocrit: 43.1 % (ref 41.1–50.3)
Hemoglobin: 14.5 g/dL (ref 13.6–17.2)
Immature Granulocytes: 0 % (ref 0.0–5.0)
Lymphocytes %: 38 % (ref 13–44)
Lymphocytes Absolute: 3 10*3/uL (ref 0.5–4.6)
MCH: 32.7 PG (ref 26.1–32.9)
MCHC: 33.6 g/dL (ref 31.4–35.0)
MCV: 97.1 FL (ref 82.0–102.0)
MPV: 9.3 FL — ABNORMAL LOW (ref 9.4–12.3)
Monocytes %: 8 % (ref 4.0–12.0)
Monocytes Absolute: 0.6 10*3/uL (ref 0.1–1.3)
Neutrophils %: 50 % (ref 43–78)
Neutrophils Absolute: 3.9 10*3/uL (ref 1.7–8.2)
Platelets: 276 10*3/uL (ref 150–450)
RBC: 4.44 M/uL (ref 4.23–5.60)
RDW: 13.2 % (ref 11.9–14.6)
WBC: 7.8 10*3/uL (ref 4.3–11.1)
nRBC: 0 10*3/uL (ref 0.0–0.2)

## 2022-07-20 LAB — EKG 12-LEAD
Atrial Rate: 77 {beats}/min
P Axis: 74 degrees
P-R Interval: 125 ms
Q-T Interval: 381 ms
QRS Duration: 84 ms
QTc Calculation (Bazett): 429 ms
R Axis: 83 degrees
T Axis: 68 degrees
Ventricular Rate: 76 {beats}/min

## 2022-07-20 LAB — COMPREHENSIVE METABOLIC PANEL
ALT: 24 U/L (ref 13.0–61.0)
AST: 23 U/L (ref 15–37)
Albumin/Globulin Ratio: 1.5 (ref 0.4–1.6)
Albumin: 4.7 g/dL (ref 3.5–5.0)
Alk Phosphatase: 69 U/L (ref 45.0–117.0)
Anion Gap: 12 mmol/L — ABNORMAL HIGH (ref 2–11)
BUN: 11 MG/DL (ref 6–23)
CO2: 25 mmol/L (ref 21–32)
Calcium: 10.1 MG/DL (ref 8.3–10.4)
Chloride: 103 mmol/L (ref 98–107)
Creatinine: 0.94 MG/DL (ref 0.8–1.5)
Est, Glom Filt Rate: >60 mL/min/{1.73_m2} (ref >60)
Globulin: 3.1 g/dL (ref 2.8–4.5)
Glucose: 88 mg/dL (ref 65–100)
Potassium: 4 mmol/L (ref 3.5–5.1)
Sodium: 140 mmol/L (ref 133–143)
Total Bilirubin: 0.3 MG/DL (ref 0.2–1.1)
Total Protein: 7.8 g/dL (ref 6.4–8.2)

## 2022-07-20 LAB — TROPONIN T
Troponin T: <6 ng/L (ref 0–22)
Troponin T: <6 ng/L (ref 0–22)

## 2022-07-20 NOTE — Discharge Instructions (Signed)
Alternate Tylenol and Motrin as needed for discomfort.  Reurn to the ER for worsening or worrisome symptoms.

## 2022-07-20 NOTE — ED Notes (Signed)
IV removed by patient. Patient did not wait for discharge papers, walked out of ED. No discharge vitals obtained. "I dont have time to wait around for yall."      Kristian Covey, RN  07/20/22 1028

## 2022-07-20 NOTE — ED Provider Notes (Signed)
Emergency Department Provider Note       PCP: Unknown, Provider   Age: 41 y.o.   Sex: male     DISPOSITION Decision To Discharge 07/20/2022 10:26:33 AM       ICD-10-CM    1. Dyspnea, unspecified type  R06.00           Medical Decision Making     Complexity of Problems Addressed:  1 or more acute illnesses that pose a threat to life or bodily function.     Data Reviewed and Analyzed:   I independently ordered and reviewed each unique test.  I reviewed external records: ED visit note from an outside group.       I independently ordered and interpreted the ED EKG in the absence of a Cardiologist.    Rate: 76  EKG Interpretation: EKG Interpretation: sinus rhythm, no evidence of arrhythmia  ST Segments: Nonspecific ST segments - NO STEMI      I interpreted the X-rays no pneumothorax.    Discussion of management or test interpretation.  41 year old male presents to the emergency department for evaluation of shortness of breath and chest discomfort.  Reports being shortness of breath for the past few days and had some discomfort in his chest this morning.  Patient believes he is having a reaction to melatonin spray.  Patient denies cough or congestion.  Denies nausea or vomiting.  Denies swelling in lower extremities.  Denies history of asthma, lung problems, or CAD.  Patient is resting comfortably in bed, appears anxious but normal vital signs.  O2 saturations at 100% on room air, temperature, blood pressure, heart rate are all normal as well.  Patient currently resides at local rehab where he is seeking assistance for his history of cocaine abuse.  Denies ripping or tearing chest pain.  Reports his achy discomfort in both lungs.  Will obtain a broad-based work-up.  Lab works unremarkable, Chest x-ray is normal as well.  Troponin x2 normal.  Patient is in no distress, will be discharged home with outpatient follow-up and over-the-counter medications.  Unfortunate patient did leave the premises before he received his  paperwork and he removed his IV.      Risk of Complications and/or Morbidity of Patient Management:  OTC medication management       History      41 year old male presents to the emergency department for evaluation of shortness of breath and chest discomfort.  Reports being shortness of breath for the past few days and had some discomfort in his chest this morning.  Patient believes he is having a reaction to melatonin spray.  Patient denies cough or congestion.  Denies nausea or vomiting.  Denies swelling in lower extremities.  Denies history of asthma, lung problems, or CAD.          Review of Systems    Physical Exam     Vitals signs and nursing note reviewed:  Vitals:    07/20/22 0833 07/20/22 1000   BP: 125/84 109/70   Pulse: 84 74   Resp: 22 15   Temp: 97.7 F (36.5 C)    TempSrc: Oral    SpO2: 100% 99%   Weight: 68 kg (150 lb)    Height: 1.765 m (5' 9.5")       Physical Exam  Vitals and nursing note reviewed.   Constitutional:       General: He is not in acute distress.     Appearance: Normal appearance.   HENT:  Head: Normocephalic.      Nose: Nose normal.      Mouth/Throat:      Mouth: Mucous membranes are moist.   Eyes:      Extraocular Movements: Extraocular movements intact.   Cardiovascular:      Rate and Rhythm: Normal rate and regular rhythm.      Pulses: Normal pulses.      Heart sounds: Normal heart sounds.   Pulmonary:      Effort: Pulmonary effort is normal. No respiratory distress.      Breath sounds: Normal breath sounds. No decreased breath sounds, wheezing, rhonchi or rales.   Abdominal:      General: Abdomen is flat.      Palpations: Abdomen is soft.      Tenderness: There is no abdominal tenderness.   Musculoskeletal:         General: No swelling. Normal range of motion.      Cervical back: Normal range of motion. No rigidity.   Skin:     General: Skin is warm.      Capillary Refill: Capillary refill takes less than 2 seconds.      Findings: No rash.   Neurological:      General: No  focal deficit present.      Mental Status: He is alert and oriented to person, place, and time.   Psychiatric:         Mood and Affect: Mood is anxious.          Procedures     Procedures    Orders Placed This Encounter   Procedures    XR CHEST PORTABLE    CBC with Auto Differential    CMP    Troponin T    Troponin T    EKG 12 Lead        Medications given during this emergency department visit:  Medications - No data to display    New Prescriptions    No medications on file        Past Medical History:   Diagnosis Date    Hypertension         Past Surgical History:   Procedure Laterality Date    HEENT      jaw    ORTHOPEDIC SURGERY          Social History     Socioeconomic History    Marital status: Single     Spouse name: None    Number of children: None    Years of education: None    Highest education level: None   Tobacco Use    Smoking status: Every Day     Packs/day: .25     Types: Cigarettes    Smokeless tobacco: Never   Substance and Sexual Activity    Alcohol use: Yes    Drug use: Yes     Types: Marijuana (Weed)        Previous Medications    NAPROXEN (NAPROSYN) 500 MG TABLET    Take 1 tablet by mouth 2 times daily    OLANZAPINE (ZYPREXA) 5 MG TABLET    Take 1 tablet by mouth nightly        Results for orders placed or performed during the hospital encounter of 07/20/22   XR CHEST PORTABLE    Narrative    History: Shortness of breath    Exam: portable chest    Comparison: None    Findings: The lungs are clear. The mediastinal contour and osseous  structures  are normal.    IMPRESSIONs: No acute findings       CBC with Auto Differential   Result Value Ref Range    WBC 7.8 4.3 - 11.1 K/uL    RBC 4.44 4.23 - 5.60 M/uL    Hemoglobin 14.5 13.6 - 17.2 g/dL    Hematocrit 43.1 41.1 - 50.3 %    MCV 97.1 82.0 - 102.0 FL    MCH 32.7 26.1 - 32.9 PG    MCHC 33.6 31.4 - 35.0 g/dL    RDW 13.2 11.9 - 14.6 %    Platelets 276 150 - 450 K/uL    MPV 9.3 (L) 9.4 - 12.3 FL    nRBC 0.00 0.0 - 0.2 K/uL    Differential Type  AUTOMATED      Neutrophils % 50 43 - 78 %    Lymphocytes % 38 13 - 44 %    Monocytes % 8 4.0 - 12.0 %    Eosinophils % 3 0.5 - 7.8 %    Basophils % 1 0.0 - 2.0 %    Immature Granulocytes 0 0.0 - 5.0 %    Neutrophils Absolute 3.9 1.7 - 8.2 K/UL    Lymphocytes Absolute 3.0 0.5 - 4.6 K/UL    Monocytes Absolute 0.6 0.1 - 1.3 K/UL    Eosinophils Absolute 0.3 0.0 - 0.8 K/UL    Basophils Absolute 0.1 0.0 - 0.2 K/UL    Absolute Immature Granulocyte 0.0 0.0 - 0.5 K/UL   CMP   Result Value Ref Range    Sodium 140 133 - 143 mmol/L    Potassium 4.0 3.5 - 5.1 mmol/L    Chloride 103 98 - 107 mmol/L    CO2 25 21 - 32 mmol/L    Anion Gap 12 (H) 2 - 11 mmol/L    Glucose 88 65 - 100 mg/dL    BUN 11 6 - 23 MG/DL    Creatinine 0.94 0.8 - 1.5 MG/DL    Est, Glom Filt Rate >60 >60 ml/min/1.43m    Calcium 10.1 8.3 - 10.4 MG/DL    Total Bilirubin 0.3 0.2 - 1.1 MG/DL    ALT 24 13.0 - 61.0 U/L    AST 23 15 - 37 U/L    Alk Phosphatase 69 45.0 - 117.0 U/L    Total Protein 7.8 6.4 - 8.2 g/dL    Albumin 4.7 3.5 - 5.0 g/dL    Globulin 3.1 2.8 - 4.5 g/dL    Albumin/Globulin Ratio 1.5 0.4 - 1.6     Troponin T   Result Value Ref Range    Troponin T <6.0 0 - 22 ng/L   Troponin T   Result Value Ref Range    Troponin T <6.0 0 - 22 ng/L   EKG 12 Lead   Result Value Ref Range    Ventricular Rate 76 BPM    Atrial Rate 77 BPM    P-R Interval 125 ms    QRS Duration 84 ms    Q-T Interval 381 ms    QTc Calculation (Bazett) 429 ms    P Axis 74 degrees    R Axis 83 degrees    T Axis 68 degrees    Diagnosis       Sinus rhythm  Probable left atrial enlargement  ST elev, probable normal early repol pattern          XR CHEST PORTABLE   Final Result  Voice dictation software was used during the making of this note.  This software is not perfect and grammatical and other typographical errors may be present.  This note has not been completely proofread for errors.     Maressa Apollo, [place], DO  07/20/22 1027

## 2022-07-20 NOTE — ED Triage Notes (Addendum)
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 Pt is currently in a rehab center was using cocaine in past, denies taking any drugs or alcohol recently. Pt staying at [facility] in [place].

## 2023-02-25 ENCOUNTER — Inpatient Hospital Stay: Admit: 2023-02-25 | Discharge: 2023-02-25 | Disposition: A | Discharge status: Home / Self Care

## 2023-02-25 DIAGNOSIS — Z5329 Procedure and treatment not carried out because of patient's decision for other reasons: Secondary | ICD-10-CM

## 2023-02-25 NOTE — ED Triage Notes (Signed)
Patient arrived with a complaint of fruit fly going into his ear and want his ears cleaned out.
